# Patient Record
Sex: Female | Born: 1935 | Race: White | Hispanic: No | Marital: Married | Smoking: Never smoker
Health system: Southern US, Community
[De-identification: ages and names within clinical notes are randomized; demographics above are authoritative.]

## PROBLEM LIST (undated history)

## (undated) DIAGNOSIS — F329 Major depressive disorder, single episode, unspecified: Secondary | ICD-10-CM

## (undated) DIAGNOSIS — K635 Polyp of colon: Secondary | ICD-10-CM

## (undated) DIAGNOSIS — Z923 Personal history of irradiation: Secondary | ICD-10-CM

## (undated) DIAGNOSIS — D649 Anemia, unspecified: Secondary | ICD-10-CM

## (undated) DIAGNOSIS — N189 Chronic kidney disease, unspecified: Secondary | ICD-10-CM

## (undated) DIAGNOSIS — K2950 Unspecified chronic gastritis without bleeding: Secondary | ICD-10-CM

## (undated) DIAGNOSIS — F32A Depression, unspecified: Secondary | ICD-10-CM

## (undated) DIAGNOSIS — C50919 Malignant neoplasm of unspecified site of unspecified female breast: Secondary | ICD-10-CM

## (undated) DIAGNOSIS — K859 Acute pancreatitis without necrosis or infection, unspecified: Secondary | ICD-10-CM

## (undated) DIAGNOSIS — M810 Age-related osteoporosis without current pathological fracture: Secondary | ICD-10-CM

## (undated) DIAGNOSIS — C649 Malignant neoplasm of unspecified kidney, except renal pelvis: Secondary | ICD-10-CM

## (undated) DIAGNOSIS — I10 Essential (primary) hypertension: Secondary | ICD-10-CM

## (undated) DIAGNOSIS — K259 Gastric ulcer, unspecified as acute or chronic, without hemorrhage or perforation: Secondary | ICD-10-CM

## (undated) DIAGNOSIS — M109 Gout, unspecified: Secondary | ICD-10-CM

## (undated) DIAGNOSIS — E785 Hyperlipidemia, unspecified: Secondary | ICD-10-CM

## (undated) HISTORY — DX: Unspecified chronic gastritis without bleeding: K29.50

## (undated) HISTORY — PX: THYROID SURGERY: SHX805

## (undated) HISTORY — PX: NEPHRECTOMY: SHX65

## (undated) HISTORY — DX: Anemia, unspecified: D64.9

## (undated) HISTORY — DX: Hyperlipidemia, unspecified: E78.5

## (undated) HISTORY — PX: KIDNEY SURGERY: SHX687

## (undated) HISTORY — DX: Chronic kidney disease, unspecified: N18.9

## (undated) HISTORY — DX: Major depressive disorder, single episode, unspecified: F32.9

## (undated) HISTORY — PX: LUMBAR LAMINECTOMY: SHX95

## (undated) HISTORY — DX: Malignant neoplasm of unspecified kidney, except renal pelvis: C64.9

## (undated) HISTORY — DX: Gout, unspecified: M10.9

## (undated) HISTORY — DX: Gastric ulcer, unspecified as acute or chronic, without hemorrhage or perforation: K25.9

## (undated) HISTORY — PX: TONSILLECTOMY: SUR1361

## (undated) HISTORY — PX: CHOLECYSTECTOMY: SHX55

## (undated) HISTORY — DX: Age-related osteoporosis without current pathological fracture: M81.0

## (undated) HISTORY — DX: Depression, unspecified: F32.A

## (undated) HISTORY — DX: Malignant neoplasm of unspecified site of unspecified female breast: C50.919

## (undated) HISTORY — DX: Polyp of colon: K63.5

## (undated) HISTORY — DX: Essential (primary) hypertension: I10

## (undated) HISTORY — DX: Acute pancreatitis without necrosis or infection, unspecified: K85.90

---

## 2006-02-09 ENCOUNTER — Ambulatory Visit: Payer: Self-pay | Admitting: Internal Medicine

## 2006-10-08 ENCOUNTER — Emergency Department: Payer: Self-pay

## 2006-10-28 ENCOUNTER — Inpatient Hospital Stay: Payer: Self-pay | Admitting: Internal Medicine

## 2006-10-28 ENCOUNTER — Other Ambulatory Visit: Payer: Self-pay

## 2007-02-17 ENCOUNTER — Ambulatory Visit: Payer: Self-pay | Admitting: Internal Medicine

## 2007-08-09 ENCOUNTER — Ambulatory Visit: Payer: Self-pay | Admitting: Internal Medicine

## 2008-08-28 ENCOUNTER — Ambulatory Visit: Payer: Self-pay | Admitting: Internal Medicine

## 2008-11-15 ENCOUNTER — Ambulatory Visit: Payer: Self-pay | Admitting: Internal Medicine

## 2008-11-27 ENCOUNTER — Ambulatory Visit: Payer: Self-pay | Admitting: Internal Medicine

## 2008-12-25 ENCOUNTER — Ambulatory Visit: Payer: Self-pay | Admitting: Surgery

## 2009-01-05 ENCOUNTER — Ambulatory Visit: Payer: Self-pay | Admitting: Internal Medicine

## 2009-01-05 DIAGNOSIS — Z923 Personal history of irradiation: Secondary | ICD-10-CM

## 2009-01-05 HISTORY — PX: BREAST LUMPECTOMY: SHX2

## 2009-01-05 HISTORY — PX: BREAST EXCISIONAL BIOPSY: SUR124

## 2009-01-05 HISTORY — DX: Personal history of irradiation: Z92.3

## 2009-01-08 ENCOUNTER — Ambulatory Visit: Payer: Self-pay | Admitting: Surgery

## 2009-01-11 ENCOUNTER — Ambulatory Visit: Payer: Self-pay | Admitting: Surgery

## 2009-01-23 ENCOUNTER — Ambulatory Visit: Payer: Self-pay | Admitting: Internal Medicine

## 2009-02-05 ENCOUNTER — Ambulatory Visit: Payer: Self-pay | Admitting: Internal Medicine

## 2009-02-05 ENCOUNTER — Ambulatory Visit: Payer: Self-pay | Admitting: Radiation Oncology

## 2009-03-05 ENCOUNTER — Ambulatory Visit: Payer: Self-pay | Admitting: Internal Medicine

## 2009-03-05 ENCOUNTER — Ambulatory Visit: Payer: Self-pay | Admitting: Radiation Oncology

## 2009-04-05 ENCOUNTER — Ambulatory Visit: Payer: Self-pay | Admitting: Radiation Oncology

## 2009-04-05 ENCOUNTER — Ambulatory Visit: Payer: Self-pay | Admitting: Internal Medicine

## 2009-05-05 ENCOUNTER — Ambulatory Visit: Payer: Self-pay | Admitting: Radiation Oncology

## 2009-05-05 ENCOUNTER — Ambulatory Visit: Payer: Self-pay | Admitting: Internal Medicine

## 2009-07-05 ENCOUNTER — Ambulatory Visit: Payer: Self-pay | Admitting: Internal Medicine

## 2009-07-17 ENCOUNTER — Ambulatory Visit: Payer: Self-pay | Admitting: Internal Medicine

## 2009-08-05 ENCOUNTER — Ambulatory Visit: Payer: Self-pay | Admitting: Internal Medicine

## 2009-09-05 ENCOUNTER — Ambulatory Visit: Payer: Self-pay | Admitting: Radiation Oncology

## 2009-10-02 ENCOUNTER — Ambulatory Visit: Payer: Self-pay | Admitting: Radiation Oncology

## 2009-10-05 ENCOUNTER — Ambulatory Visit: Payer: Self-pay | Admitting: Radiation Oncology

## 2009-11-20 ENCOUNTER — Ambulatory Visit: Payer: Self-pay | Admitting: Internal Medicine

## 2009-11-21 ENCOUNTER — Ambulatory Visit: Payer: Self-pay | Admitting: Internal Medicine

## 2009-12-05 ENCOUNTER — Ambulatory Visit: Payer: Self-pay | Admitting: Internal Medicine

## 2010-05-15 ENCOUNTER — Ambulatory Visit: Payer: Self-pay | Admitting: Gastroenterology

## 2010-05-19 LAB — PATHOLOGY REPORT

## 2010-05-23 ENCOUNTER — Ambulatory Visit: Payer: Self-pay | Admitting: Internal Medicine

## 2010-06-06 ENCOUNTER — Ambulatory Visit: Payer: Self-pay | Admitting: Internal Medicine

## 2010-10-06 ENCOUNTER — Ambulatory Visit: Payer: Self-pay | Admitting: Internal Medicine

## 2010-11-06 ENCOUNTER — Ambulatory Visit: Payer: Self-pay | Admitting: Internal Medicine

## 2010-12-03 ENCOUNTER — Ambulatory Visit: Payer: Self-pay | Admitting: Family Medicine

## 2011-03-23 ENCOUNTER — Ambulatory Visit: Payer: Self-pay | Admitting: Internal Medicine

## 2011-03-23 LAB — CBC CANCER CENTER
Basophil #: 0 x10 3/mm (ref 0.0–0.1)
Basophil %: 0.4 %
Eosinophil #: 0.3 x10 3/mm (ref 0.0–0.7)
HCT: 34.9 % — ABNORMAL LOW (ref 35.0–47.0)
Lymphocyte #: 1.3 x10 3/mm (ref 1.0–3.6)
MCH: 30.8 pg (ref 26.0–34.0)
MCV: 91 fL (ref 80–100)
Monocyte #: 0.4 x10 3/mm (ref 0.0–0.7)
Monocyte %: 7.2 %
Neutrophil #: 4.1 x10 3/mm (ref 1.4–6.5)
RBC: 3.83 10*6/uL (ref 3.80–5.20)
RDW: 15.6 % — ABNORMAL HIGH (ref 11.5–14.5)
WBC: 6.1 x10 3/mm (ref 3.6–11.0)

## 2011-03-23 LAB — CREATININE, SERUM
Creatinine: 2.64 mg/dL — ABNORMAL HIGH (ref 0.60–1.30)
EGFR (African American): 23 — ABNORMAL LOW

## 2011-03-23 LAB — HEPATIC FUNCTION PANEL A (ARMC)
Alkaline Phosphatase: 61 U/L (ref 50–136)
Bilirubin, Direct: 0.1 mg/dL (ref 0.00–0.20)
Bilirubin,Total: 0.2 mg/dL (ref 0.2–1.0)
SGPT (ALT): 37 U/L
Total Protein: 7.6 g/dL (ref 6.4–8.2)

## 2011-04-06 ENCOUNTER — Ambulatory Visit: Payer: Self-pay | Admitting: Internal Medicine

## 2011-07-12 ENCOUNTER — Emergency Department: Payer: Self-pay | Admitting: Emergency Medicine

## 2011-07-12 LAB — URINALYSIS, COMPLETE
Bacteria: NONE SEEN
Bilirubin,UR: NEGATIVE
Blood: NEGATIVE
Glucose,UR: NEGATIVE mg/dL (ref 0–75)
Leukocyte Esterase: NEGATIVE
Nitrite: NEGATIVE
Protein: NEGATIVE
Specific Gravity: 1.003 (ref 1.003–1.030)

## 2011-07-12 LAB — COMPREHENSIVE METABOLIC PANEL
Albumin: 4 g/dL (ref 3.4–5.0)
Alkaline Phosphatase: 72 U/L (ref 50–136)
Anion Gap: 6 — ABNORMAL LOW (ref 7–16)
BUN: 65 mg/dL — ABNORMAL HIGH (ref 7–18)
Bilirubin,Total: 0.2 mg/dL (ref 0.2–1.0)
Calcium, Total: 9.2 mg/dL (ref 8.5–10.1)
Glucose: 123 mg/dL — ABNORMAL HIGH (ref 65–99)
Osmolality: 296 (ref 275–301)
Potassium: 4.3 mmol/L (ref 3.5–5.1)
SGOT(AST): 23 U/L (ref 15–37)
SGPT (ALT): 24 U/L
Sodium: 138 mmol/L (ref 136–145)
Total Protein: 8 g/dL (ref 6.4–8.2)

## 2011-07-12 LAB — CBC
HGB: 12.2 g/dL (ref 12.0–16.0)
MCH: 30.8 pg (ref 26.0–34.0)
MCV: 93 fL (ref 80–100)
RBC: 3.98 10*6/uL (ref 3.80–5.20)
WBC: 5.8 10*3/uL (ref 3.6–11.0)

## 2011-07-12 LAB — LIPASE, BLOOD: Lipase: 613 U/L — ABNORMAL HIGH (ref 73–393)

## 2011-10-12 ENCOUNTER — Ambulatory Visit: Payer: Self-pay | Admitting: Internal Medicine

## 2011-12-10 ENCOUNTER — Ambulatory Visit: Payer: Self-pay | Admitting: Family Medicine

## 2011-12-23 ENCOUNTER — Ambulatory Visit: Payer: Self-pay | Admitting: Internal Medicine

## 2011-12-23 LAB — CREATININE, SERUM: EGFR (African American): 19 — ABNORMAL LOW

## 2011-12-23 LAB — CBC CANCER CENTER
Basophil #: 0.1 x10 3/mm (ref 0.0–0.1)
Eosinophil #: 0.2 x10 3/mm (ref 0.0–0.7)
HCT: 33.9 % — ABNORMAL LOW (ref 35.0–47.0)
Lymphocyte #: 0.9 x10 3/mm — ABNORMAL LOW (ref 1.0–3.6)
MCH: 31.5 pg (ref 26.0–34.0)
MCHC: 34.7 g/dL (ref 32.0–36.0)
MCV: 91 fL (ref 80–100)
Monocyte #: 0.5 x10 3/mm (ref 0.2–0.9)
Neutrophil #: 3.9 x10 3/mm (ref 1.4–6.5)
RBC: 3.74 10*6/uL — ABNORMAL LOW (ref 3.80–5.20)
RDW: 13.5 % (ref 11.5–14.5)

## 2011-12-23 LAB — HEPATIC FUNCTION PANEL A (ARMC)
Alkaline Phosphatase: 74 U/L (ref 50–136)
Bilirubin, Direct: <0.05 mg/dL (ref 0.00–0.20)
Bilirubin,Total: 0.2 mg/dL (ref 0.2–1.0)
SGOT(AST): 17 U/L (ref 15–37)

## 2012-01-06 ENCOUNTER — Ambulatory Visit: Payer: Self-pay | Admitting: Internal Medicine

## 2012-04-12 ENCOUNTER — Ambulatory Visit: Payer: Self-pay | Admitting: Family Medicine

## 2012-04-21 ENCOUNTER — Ambulatory Visit: Payer: Self-pay | Admitting: Family Medicine

## 2012-04-28 ENCOUNTER — Ambulatory Visit: Payer: Self-pay | Admitting: Family Medicine

## 2012-05-06 ENCOUNTER — Ambulatory Visit: Payer: Self-pay | Admitting: Gastroenterology

## 2012-05-11 ENCOUNTER — Ambulatory Visit: Payer: Self-pay | Admitting: Gastroenterology

## 2012-05-11 LAB — BASIC METABOLIC PANEL
Calcium, Total: 9.1 mg/dL (ref 8.5–10.1)
Co2: 28 mmol/L (ref 21–32)
Creatinine: 2.27 mg/dL — ABNORMAL HIGH (ref 0.60–1.30)
EGFR (African American): 24 — ABNORMAL LOW
Osmolality: 288 (ref 275–301)

## 2012-06-05 ENCOUNTER — Ambulatory Visit: Payer: Self-pay | Admitting: Internal Medicine

## 2012-06-08 ENCOUNTER — Ambulatory Visit: Payer: Self-pay | Admitting: Surgery

## 2012-06-08 DIAGNOSIS — I10 Essential (primary) hypertension: Secondary | ICD-10-CM

## 2012-06-13 ENCOUNTER — Ambulatory Visit: Payer: Self-pay | Admitting: Surgery

## 2012-06-14 LAB — PATHOLOGY REPORT

## 2012-06-29 ENCOUNTER — Ambulatory Visit: Payer: Self-pay | Admitting: Internal Medicine

## 2012-06-29 LAB — CREATININE, SERUM
Creatinine: 3.06 mg/dL — ABNORMAL HIGH (ref 0.60–1.30)
EGFR (African American): 17 — ABNORMAL LOW
EGFR (Non-African Amer.): 14 — ABNORMAL LOW

## 2012-06-29 LAB — CBC CANCER CENTER
Basophil #: 0.1 x10 3/mm (ref 0.0–0.1)
Basophil %: 1.4 %
Eosinophil #: 0.2 x10 3/mm (ref 0.0–0.7)
Eosinophil %: 2.5 %
HCT: 34.3 % — ABNORMAL LOW (ref 35.0–47.0)
Lymphocyte #: 1 x10 3/mm (ref 1.0–3.6)
Lymphocyte %: 15 %
MCH: 31.2 pg (ref 26.0–34.0)
MCHC: 34.1 g/dL (ref 32.0–36.0)
Monocyte %: 6.4 %
Neutrophil #: 5 x10 3/mm (ref 1.4–6.5)
Neutrophil %: 74.7 %
RDW: 12.9 % (ref 11.5–14.5)

## 2012-06-29 LAB — HEPATIC FUNCTION PANEL A (ARMC)
Alkaline Phosphatase: 80 U/L (ref 50–136)
Bilirubin, Direct: 0.1 mg/dL (ref 0.00–0.20)
Bilirubin,Total: 0.3 mg/dL (ref 0.2–1.0)
SGPT (ALT): 28 U/L (ref 12–78)
Total Protein: 7.5 g/dL (ref 6.4–8.2)

## 2012-07-05 ENCOUNTER — Ambulatory Visit: Payer: Self-pay | Admitting: Internal Medicine

## 2012-10-26 ENCOUNTER — Ambulatory Visit: Payer: Self-pay | Admitting: Internal Medicine

## 2012-11-05 ENCOUNTER — Ambulatory Visit: Payer: Self-pay | Admitting: Internal Medicine

## 2012-12-12 ENCOUNTER — Ambulatory Visit: Payer: Self-pay | Admitting: Family Medicine

## 2012-12-27 ENCOUNTER — Emergency Department: Payer: Self-pay | Admitting: Emergency Medicine

## 2013-07-10 ENCOUNTER — Ambulatory Visit: Payer: Self-pay | Admitting: Internal Medicine

## 2013-07-10 LAB — CBC CANCER CENTER
Basophil #: 0.1 x10 3/mm (ref 0.0–0.1)
Basophil %: 1.1 %
EOS PCT: 3 %
Eosinophil #: 0.2 x10 3/mm (ref 0.0–0.7)
HCT: 34.4 % — ABNORMAL LOW (ref 35.0–47.0)
HGB: 11.2 g/dL — ABNORMAL LOW (ref 12.0–16.0)
Lymphocyte #: 1 x10 3/mm (ref 1.0–3.6)
Lymphocyte %: 15.2 %
MCH: 29.9 pg (ref 26.0–34.0)
MCHC: 32.6 g/dL (ref 32.0–36.0)
MCV: 92 fL (ref 80–100)
MONO ABS: 0.5 x10 3/mm (ref 0.2–0.9)
Monocyte %: 7.6 %
NEUTROS ABS: 4.7 x10 3/mm (ref 1.4–6.5)
NEUTROS PCT: 73.1 %
Platelet: 180 x10 3/mm (ref 150–440)
RBC: 3.75 10*6/uL — ABNORMAL LOW (ref 3.80–5.20)
RDW: 13.4 % (ref 11.5–14.5)
WBC: 6.4 x10 3/mm (ref 3.6–11.0)

## 2013-07-10 LAB — CREATININE, SERUM
Creatinine: 2.53 mg/dL — ABNORMAL HIGH (ref 0.60–1.30)
EGFR (African American): 21 — ABNORMAL LOW
GFR CALC NON AF AMER: 18 — AB

## 2013-07-10 LAB — HEPATIC FUNCTION PANEL A (ARMC)
Albumin: 3.8 g/dL (ref 3.4–5.0)
Alkaline Phosphatase: 58 U/L
BILIRUBIN TOTAL: 0.2 mg/dL (ref 0.2–1.0)
SGOT(AST): 13 U/L — ABNORMAL LOW (ref 15–37)
SGPT (ALT): 20 U/L (ref 12–78)
TOTAL PROTEIN: 7.3 g/dL (ref 6.4–8.2)

## 2013-08-05 ENCOUNTER — Ambulatory Visit: Payer: Self-pay | Admitting: Internal Medicine

## 2013-08-24 ENCOUNTER — Ambulatory Visit: Payer: Self-pay | Admitting: Gastroenterology

## 2013-08-25 LAB — PATHOLOGY REPORT

## 2013-10-25 ENCOUNTER — Ambulatory Visit: Payer: Self-pay | Admitting: Internal Medicine

## 2013-11-05 ENCOUNTER — Ambulatory Visit: Payer: Self-pay | Admitting: Internal Medicine

## 2013-12-27 ENCOUNTER — Ambulatory Visit: Payer: Self-pay | Admitting: Family Medicine

## 2014-03-29 ENCOUNTER — Ambulatory Visit: Payer: Self-pay | Admitting: Surgery

## 2014-04-27 NOTE — Op Note (Signed)
PATIENT NAME:  Erin Mccormick, Erin Mccormick MR#:  [id number] DATE OF BIRTH:  11-May-1935  DATE OF PROCEDURE:  06/13/2012  PREOPERATIVE DIAGNOSIS: Chronic acalculus cholecystitis.   POSTOPERATIVE DIAGNOSIS: Chronic acalculus cholecystitis.   PROCEDURE: Laparoscopic cholecystectomy.   SURGEON: Rochel Brome, M.D.   ANESTHESIA: General.   INDICATIONS: This 79 year old female has a history of epigastric pains, abnormally low gallbladder ejection fraction of 30% and surgery was recommended for definitive treatment.   DESCRIPTION OF PROCEDURE: The patient was placed on the operating table in the supine position under general anesthesia. The abdomen was prepared with ChloraPrep and draped in a sterile manner. A short incision was made in the inferior aspect of the umbilicus and carried down to the deep fascia, which was grasped with laryngeal hook and elevated. A Veress needle was inserted, aspirated, and irrigated with saline solution. Next, the peritoneal cavity was inflated with carbon dioxide. The Veress needle was removed. The 10 mm cannula was inserted. The 10 mm 0 degree laparoscope was inserted to view the peritoneal cavity. Another incision was made in the epigastrium slightly to the right of the midline to introduce an 11 mm cannula. Two incisions were made in the lateral aspect of the right upper quadrant to introduce two 5 mm cannulas.   Initial inspection revealed that there were some adhesions surrounding the gallbladder. The gallbladder was retracted towards the right shoulder. Multiple adhesions were taken down with blunt and sharp dissection and use of electrocautery. The pouch of Randol Kern was retracted inferiorly and laterally. The common bile duct was identified. Dissection was carried out to isolate the cystic duct from surrounding structures. Also, the cystic artery was dissected from free from surrounding structures. The neck of the gallbladder was mobilized with incision of the visceral peritoneum. A  critical view of safety was demonstrated. An Endo Clip was placed across the cystic duct adjacent to the neck of the gallbladder. An incision was made in the cystic duct to introduce a Reddick catheter. The catheter would only thread in just a few millimeters and would not stay in and, therefore, cholangiogram was not done. The Reddick catheter was removed. The cystic duct was doubly ligated with endoclips and divided. The cystic artery was controlled with double endoclips and divided. A small amount of blood was aspirated. The gallbladder was dissected free from the liver with hook and cautery. Hemostasis was subsequently intact. The gallbladder was delivered up through the infraumbilical incision, opened and suctioned, removed and submitted in formalin for routine pathology. The right upper quadrant was further inspected. Hemostasis was intact. The cannulas were removed. Carbon dioxide was allowed to escape from the peritoneal cavity. Skin incisions were closed with interrupted 5-0 chromic subcuticular sutures, benzoin, and Steri-Strips. Dressings were applied with paper tape. The patient tolerated surgery satisfactorily and was then prepared for transfer to the recovery room.   ____________________________ Lenna Sciara. Rochel Brome, MD jws:aw D: 06/13/2012 10:55:58 ET T: 06/13/2012 12:03:42 ET JOB#: [id number]  cc: Loreli Dollar, MD, <Dictator> Loreli Dollar MD ELECTRONICALLY SIGNED 06/15/2012 8:40

## 2014-05-18 ENCOUNTER — Other Ambulatory Visit: Payer: Self-pay | Admitting: Internal Medicine

## 2014-05-18 ENCOUNTER — Encounter: Payer: Self-pay | Admitting: *Deleted

## 2014-06-11 ENCOUNTER — Other Ambulatory Visit (INDEPENDENT_AMBULATORY_CARE_PROVIDER_SITE_OTHER): Payer: Medicare Other

## 2014-06-11 ENCOUNTER — Ambulatory Visit (INDEPENDENT_AMBULATORY_CARE_PROVIDER_SITE_OTHER): Payer: Medicare Other | Admitting: Internal Medicine

## 2014-06-11 ENCOUNTER — Encounter: Payer: Self-pay | Admitting: Internal Medicine

## 2014-06-11 VITALS — BP 138/60 | HR 62 | Ht 63.75 in | Wt 123.4 lb

## 2014-06-11 DIAGNOSIS — R197 Diarrhea, unspecified: Secondary | ICD-10-CM

## 2014-06-11 DIAGNOSIS — K529 Noninfective gastroenteritis and colitis, unspecified: Secondary | ICD-10-CM

## 2014-06-11 DIAGNOSIS — Z8719 Personal history of other diseases of the digestive system: Secondary | ICD-10-CM

## 2014-06-11 DIAGNOSIS — D649 Anemia, unspecified: Secondary | ICD-10-CM

## 2014-06-11 LAB — IGA: IgA: 111 mg/dL (ref 68–378)

## 2014-06-11 MED ORDER — DIPHENOXYLATE-ATROPINE 2.5-0.025 MG PO TABS: 1.0000 | ORAL_TABLET | Freq: Four times a day (QID) | ORAL | Status: DC | PRN

## 2014-06-11 NOTE — Patient Instructions (Addendum)
Your physician has requested that you go to the basement for the following lab work before leaving today: IGA and TTG  We have sent the following medications to your pharmacy for you to pick up at your convenience: Lomotil (faxed to pharmacy)  Continue your current medications.  Call us back if the diarrhea returns.  Follow up with Dr Hilarie Fredrickson in 6 months, patient to call back.  We will obtain your records for [facility]  For review.   I appreciate the opportunity to care for you.

## 2014-06-11 NOTE — Progress Notes (Signed)
Patient ID: Erin Mccormick, female   DOB: 05-08-1935, 79 y.o.   MRN: [id number] HPI: Erin Mccormick is a 79 year old female with past medical history of renal cell carcinoma status post left nephrectomy in 2002, partial right nephrectomy in 2004, chronic kidney disease followed by nephrology at [facility], chronic normocytic anemia, history of breast cancer, history of gastritis, hypertension and hyperlipidemia who is seen to evaluate chronic diarrhea. She's here today with her husband. She reports 3 years ago developing urgent loose stools. This seemed to start abruptly. This started prior to cholecystectomy which occurred in 2014. She reports that her stools would occur typically after eating and can be urgent. She's had multiple accidents and for a while was wearing adult diapers because she could not predict or control now movements. Bowel movements were never bloody or melenic. They were inconsistent related to what she was eating. It was evaluated by Dr. Candace Cruise in [place] and she reportedly had a normal colonoscopy in August 2015. She has been taking pantoprazole 40 mg daily and she has moved the dosing of this around. She was taken in the morning but told to separate it from her thyroid replacement, though she moved it to before dinner and she felt like this possibly worsen her loose stools. She has now moved it to before lunch and symptoms have improved significantly. Currently she denies having diarrhea and she has been happy with her bowel movements over the last month. Stools have recently been formed and regular occurring 1-2 times per day. About 10 days ago she had a single day of diarrhea also with vomiting which is very atypical for her. She feels like she had a viral infection. She denies abdominal pain and has not had abdominal pain throughout these events. She does notice lower cramping which is relieved by defecation when she is having loose stools. Overall appetite has been good and her weight has been  stable of late. She has a history of solid food dysphagia and reportedly had an upper endoscopy with dilation and this improved the symptoms. Regarding her anemia I inquired and this is stable. She's had IV iron in the past which did not help her anemia. She denies a family history of GI tract malignancy or IBD.   Past Medical History  Diagnosis Date  . Chronic gastritis   . Renal cell carcinoma     left  . CKD (chronic kidney disease)   . Hypertension   . Anemia   . Hyperlipidemia   . Colon polyp   . Breast cancer left   . Depression   . Gastric ulcer   . Osteoporosis   . Gout     Past Surgical History  Procedure Laterality Date  . Cholecystectomy    . Nephrectomy Left   . Thyroid surgery    . Lumbar laminectomy    . Mastectomy, partial Left   . Kidney surgery Right   . Tonsillectomy      No outpatient prescriptions prior to visit.   No facility-administered medications prior to visit.    Allergies  Allergen Reactions  . Morphine And Related Nausea And Vomiting    Family History  Problem Relation Age of Onset  . Colon cancer Neg Hx   . Lung cancer Father   . Kidney disease Neg Hx     History  Substance Use Topics  . Smoking status: Never Smoker   . Smokeless tobacco: Not on file  . Alcohol Use: No    ROS: As per  history of present illness, otherwise negative  BP 138/60 mmHg  Pulse 62  Ht 5' 3.75" (1.619 m)  Wt 123 lb 6 oz (55.963 kg)  BMI 21.35 kg/m2 Constitutional: Well-developed and well-nourished. No distress. HEENT: Normocephalic and atraumatic. Oropharynx is clear and moist. No oropharyngeal exudate. Conjunctivae are normal.  No scleral icterus. Neck: Neck supple. Trachea midline. Cardiovascular: Normal rate, regular rhythm and intact distal pulses. No M/R/G Pulmonary/chest: Effort normal and breath sounds normal. No wheezing, rales or rhonchi. Abdominal: Soft, nontender, nondistended. Bowel sounds active throughout. Extremities: no clubbing,  cyanosis, or edema Lymphadenopathy: No cervical adenopathy noted. Neurological: Alert and oriented to person place and time. Skin: Skin is warm and dry. No rashes noted. Psychiatric: Normal mood and affect. Behavior is normal.  RELEVANT LABS AND IMAGING: CBC    Component Value Date/Time   WBC 6.4 07/10/2013 1419   RBC 3.75* 07/10/2013 1419   HGB 11.2* 07/10/2013 1419   HCT 34.4* 07/10/2013 1419   PLT 180 07/10/2013 1419   MCV 92 07/10/2013 1419   MCH 29.9 07/10/2013 1419   MCHC 32.6 07/10/2013 1419   RDW 13.4 07/10/2013 1419   LYMPHSABS 1.0 07/10/2013 1419   MONOABS 0.5 07/10/2013 1419   EOSABS 0.2 07/10/2013 1419   BASOSABS 0.1 07/10/2013 1419    CMP     Component Value Date/Time   NA 139 05/11/2012 1255   K 3.7 05/11/2012 1255   CL 107 05/11/2012 1255   CO2 28 05/11/2012 1255   GLUCOSE 100* 05/11/2012 1255   BUN 41* 05/11/2012 1255   CREATININE 2.53* 07/10/2013 1419   CALCIUM 9.1 05/11/2012 1255   PROT 7.3 07/10/2013 1419   ALBUMIN 3.8 07/10/2013 1419   AST 13* 07/10/2013 1419   ALT 20 07/10/2013 1419   ALKPHOS 58 07/10/2013 1419   GFRNONAA 18* 07/10/2013 1419   GFRAA 21* 07/10/2013 1419   Colonoscopy 08/24/2013 -- the entire examined colon is normal. Biopsies performed. Pathology = colonic mucosa with mildly increased nonspecific inflammation within the lamina propria. Negative for intraepithelial lymphocytosis, increased subepithelial collagen, viral cytopathic effect, dysplasia or malignancy  Labs reviewed from one week ago, CMP within normal limits with the exception of BUN 60, creatinine 2.6 Hgb A1c 5.9 WBC 3.7, hemoglobin 11.5, MCV 91.2, platelet 197 TSH 2.74  ASSESSMENT/PLAN: 79 year old female with past medical history of renal cell carcinoma status post left nephrectomy in 2002, partial right nephrectomy in 2004, chronic kidney disease followed by nephrology at [facility], chronic normocytic anemia, history of breast cancer, history of gastritis, hypertension  and hyperlipidemia who is seen to evaluate chronic diarrhea.  1. Chronic diarrhea with urgency -- symptoms under control now without medication. Can be unpredictable. Lomotil 1 tablet 4 times daily as needed for diarrhea. Records reviewed extensively. Unclear etiology of nonspecific inflammation seen on random colon biopsies from 10 months ago. Given lack of symptoms now, I am not treating specifically chronic diarrhea with medications at this time. If symptoms recur would consider trial of budesonide 9 mg daily for possible microscopic colitis. If no response will consider course of rifaximin, and if no response than possibly mesalamine. We discussed how chronic diarrhea concomitantly go and that she should notify me if this occurs. Also check celiac panel today to rule out this as a possible cause. Finally we discussed PPI, as this medication can lead to loose stools. She is currently taking it once daily and doesn't seem to be having diarrhea, see #2  2.  History of chronic gastritis -- this  alone is likely not an indication for chronic PPI. We may be able to stop this medicine. I have requested records from her previous endoscopies and pathology records for review. Once this occurs we can make decision about the need for continuous PPI therapy. Of note she denies a history of heartburn.  3. History of dysphagia -- she recalls a Schatzki's ring symptoms improved after dilation. Await EGD report to ensure no evidence of Barrett's esophagus or erosive esophagitis. Repeat dilation can be considered if dysphagia returns.  Followup in 6 months 60 min spent today, of which greater than 50% of this time with the patient directly discussing the above issues

## 2014-06-12 LAB — TISSUE TRANSGLUTAMINASE, IGA: Tissue Transglutaminase Ab, IgA: 1 U/mL (ref <4)

## 2014-06-21 ENCOUNTER — Ambulatory Visit: Payer: Medicare Other | Admitting: Internal Medicine

## 2014-07-07 ENCOUNTER — Other Ambulatory Visit: Payer: Self-pay

## 2014-07-07 DIAGNOSIS — C50912 Malignant neoplasm of unspecified site of left female breast: Secondary | ICD-10-CM

## 2014-07-10 ENCOUNTER — Telehealth: Payer: Self-pay | Admitting: *Deleted

## 2014-07-10 NOTE — Telephone Encounter (Signed)
Patient returned call. Will get those Endoscopy reports faxed to [place]. She also wanted Dr Hilarie Fredrickson to know she stopped taking Prilosec and is somewhat better now

## 2014-07-10 NOTE — Telephone Encounter (Signed)
L/M for pt to contact the office. EGD report missing from records from [facility]

## 2014-07-11 ENCOUNTER — Inpatient Hospital Stay (HOSPITAL_BASED_OUTPATIENT_CLINIC_OR_DEPARTMENT_OTHER): Payer: Medicare Other | Admitting: Internal Medicine

## 2014-07-11 ENCOUNTER — Inpatient Hospital Stay: Payer: Medicare Other | Attending: Internal Medicine

## 2014-07-11 VITALS — BP 149/56 | HR 52 | Temp 95.9°F | Wt 121.7 lb

## 2014-07-11 DIAGNOSIS — Z853 Personal history of malignant neoplasm of breast: Secondary | ICD-10-CM | POA: Diagnosis present

## 2014-07-11 DIAGNOSIS — C50912 Malignant neoplasm of unspecified site of left female breast: Secondary | ICD-10-CM

## 2014-07-11 DIAGNOSIS — Z170 Estrogen receptor positive status [ER+]: Secondary | ICD-10-CM | POA: Diagnosis not present

## 2014-07-11 DIAGNOSIS — Z905 Acquired absence of kidney: Secondary | ICD-10-CM | POA: Insufficient documentation

## 2014-07-11 DIAGNOSIS — R232 Flushing: Secondary | ICD-10-CM | POA: Insufficient documentation

## 2014-07-11 DIAGNOSIS — Z9223 Personal history of estrogen therapy: Secondary | ICD-10-CM | POA: Insufficient documentation

## 2014-07-11 DIAGNOSIS — Z85528 Personal history of other malignant neoplasm of kidney: Secondary | ICD-10-CM | POA: Diagnosis not present

## 2014-07-11 DIAGNOSIS — Z7982 Long term (current) use of aspirin: Secondary | ICD-10-CM | POA: Diagnosis not present

## 2014-07-11 DIAGNOSIS — I129 Hypertensive chronic kidney disease with stage 1 through stage 4 chronic kidney disease, or unspecified chronic kidney disease: Secondary | ICD-10-CM | POA: Insufficient documentation

## 2014-07-11 DIAGNOSIS — M810 Age-related osteoporosis without current pathological fracture: Secondary | ICD-10-CM | POA: Diagnosis not present

## 2014-07-11 DIAGNOSIS — Z79899 Other long term (current) drug therapy: Secondary | ICD-10-CM | POA: Diagnosis not present

## 2014-07-11 DIAGNOSIS — E785 Hyperlipidemia, unspecified: Secondary | ICD-10-CM

## 2014-07-11 DIAGNOSIS — N189 Chronic kidney disease, unspecified: Secondary | ICD-10-CM | POA: Insufficient documentation

## 2014-07-11 LAB — HEPATIC FUNCTION PANEL
ALT: 15 U/L (ref 14–54)
AST: 18 U/L (ref 15–41)
Albumin: 4.2 g/dL (ref 3.5–5.0)
Alkaline Phosphatase: 46 U/L (ref 38–126)
BILIRUBIN TOTAL: 0.4 mg/dL (ref 0.3–1.2)
Total Protein: 7.3 g/dL (ref 6.5–8.1)

## 2014-07-11 LAB — CBC WITH DIFFERENTIAL/PLATELET
BASOS ABS: 0.1 10*3/uL (ref 0–0.1)
BASOS PCT: 1 %
Eosinophils Absolute: 0.2 10*3/uL (ref 0–0.7)
Eosinophils Relative: 3 %
HCT: 34.7 % — ABNORMAL LOW (ref 35.0–47.0)
HEMOGLOBIN: 11.1 g/dL — AB (ref 12.0–16.0)
Lymphocytes Relative: 17 %
Lymphs Abs: 0.9 10*3/uL — ABNORMAL LOW (ref 1.0–3.6)
MCH: 28.9 pg (ref 26.0–34.0)
MCHC: 32.1 g/dL (ref 32.0–36.0)
MCV: 90.1 fL (ref 80.0–100.0)
Monocytes Absolute: 0.5 10*3/uL (ref 0.2–0.9)
Monocytes Relative: 9 %
NEUTROS ABS: 4.1 10*3/uL (ref 1.4–6.5)
NEUTROS PCT: 70 %
Platelets: 186 10*3/uL (ref 150–440)
RBC: 3.85 MIL/uL (ref 3.80–5.20)
RDW: 13.6 % (ref 11.5–14.5)
WBC: 5.7 10*3/uL (ref 3.6–11.0)

## 2014-07-11 LAB — CREATININE, SERUM
Creatinine, Ser: 2.85 mg/dL — ABNORMAL HIGH (ref 0.44–1.00)
GFR calc Af Amer: 17 mL/min — ABNORMAL LOW (ref >60)
GFR, EST NON AFRICAN AMERICAN: 15 mL/min — AB (ref >60)

## 2014-07-12 ENCOUNTER — Telehealth: Payer: Self-pay

## 2014-07-12 NOTE — Telephone Encounter (Signed)
Per Dr. Hilarie Fredrickson he reviewed her EGD and states there was chronic gastritis from 05/2012. Recommends continuing daily PPI. Pt aware.

## 2014-07-22 NOTE — Progress Notes (Signed)
[facility]  [phone] [phone]     ID: Erin Mccormick OB: 1935-02-18  MR#: [id number]  DGU#:[id number]  Patient Care Team: Dion Body, MD as PCP - General (Family Medicine)  CHIEF COMPLAINT/DIAGNOSIS:  Stage I (T1c, N0, M0 clinical) infiltrating ductal carcinoma of the left breast, status post lumpectomy and sentinel node biopsy on January 11, 2009. Tumor size 1.3 cm, grade 1, margins clear  (0.8 cm invasive carcinoma of the surrounding by dense fibrosis containing smaller invasive foci measuring up to 0.5 cm and, combined healed and overall diameter of 1.3 cm) One sentinel lymph node negative for malignancy ER positive (greater than 90%)  and PR positive (50%). Her-2 neu negative (1+ on IHC).                               January 28, 2009 - Oncotype DX score is 20 (average rate of distant recurrence of 13%). Started aromatase inhibitor Arimidex in April 2011.   HISTORY OF PRESENT ILLNESS:  Patient returns for continued oncology followup, she was seen one year ago. States that she is doing steady, denies any new complaints.  She continues to take anastrozole, has some hot flashes but denies any worsening joint pains or stiffness. Appetite and weight are steady. No new mood disturbances. No new bone pains, last DEXA scan showed mild osteoporosis, denies any fractures or new back pain.  Denies feeling any new breast masses on self-exam. No nausea or vomiting.   REVIEW OF SYSTEMS:   ROS As in HPI above. In addition, no fever, chills or sweats. No new headaches or focal weakness.  No sore throat, cough, shortness of breath, sputum, hemoptysis or chest pain. No dizziness or palpitation. No abdominal pain, constipation, diarrhea, dysuria or hematuria. No new skin rash or bleeding symptoms. No new paresthesias in extremities. PS ECOG 0.   PAST MEDICAL HISTORY: Reviewed. Past Medical History  Diagnosis Date  . Chronic gastritis   . Renal cell  carcinoma     left  . CKD (chronic kidney disease)   . Hypertension   . Anemia   . Hyperlipidemia   . Colon polyp   . Breast cancer left   . Depression   . Gastric ulcer   . Osteoporosis   . Gout           Hyperlipidemia  Left Renal Cell Carcinoma status post partial left nephrectomy in 2002  Hypothyroidism  Hypertension  Chronic renal insufficiency  Partial right nephrectomy in 2004 for benign reason  History of thyroid biopsy, denies malignancy  Right eye surgery  Colonoscopy of May 2012 - tubular adenoma x4  Lumpectomy and sentinel node study for left breast stage I invasive ductal carcinoma on January 11, 2009  PAST SURGICAL HISTORY: Reviewed. Past Surgical History  Procedure Laterality Date  . Cholecystectomy    . Nephrectomy Left   . Thyroid surgery    . Lumbar laminectomy    . Mastectomy, partial Left   . Kidney surgery Right   . Tonsillectomy      FAMILY HISTORY: Reviewed. Family History  Problem Relation Age of Onset  . Colon cancer Neg Hx   . Lung cancer Father   . Kidney disease Neg Hx     SOCIAL HISTORY: Reviewed. History  Substance Use Topics  . Smoking status: Never Smoker   . Smokeless tobacco: Not on file  . Alcohol Use: No    Allergies  Allergen Reactions  . Morphine Nausea Only and Nausea And Vomiting  . Ace Inhibitors Other (See Comments)    hyperkalemia   . Morphine And Related Nausea And Vomiting  . Allopurinol Diarrhea  . Other Rash    Adhesive bandages- causes skin irritation and redness    Current Outpatient Prescriptions  Medication Sig Dispense Refill  . amLODipine (NORVASC) 10 MG tablet Take 10 mg by mouth daily.    Marland Kitchen anastrozole (ARIMIDEX) 1 MG tablet Take 1 mg by mouth daily.    Marland Kitchen aspirin 81 MG tablet Take 81 mg by mouth daily.    . Cholecalciferol (VITAMIN D) 2000 UNITS CAPS Take 2,000 capsules by mouth daily.    . diphenoxylate-atropine (LOMOTIL) 2.5-0.025 MG per tablet Take 1 tablet by mouth 4 (four) times daily as  needed for diarrhea or loose stools. 60 tablet 1  . furosemide (LASIX) 40 MG tablet Take 40 mg by mouth daily.    Marland Kitchen levothyroxine (SYNTHROID, LEVOTHROID) 88 MCG tablet Take 88 mcg by mouth daily.    . metoprolol (TOPROL-XL) 200 MG 24 hr tablet Take 200 mg by mouth daily.    . pantoprazole (PROTONIX) 40 MG tablet Take 40 mg by mouth daily.    Marland Kitchen PARoxetine (PAXIL) 10 MG tablet Take 10 mg by mouth daily.    . pravastatin (PRAVACHOL) 20 MG tablet Take 20 mg by mouth daily.     No current facility-administered medications for this visit.    PHYSICAL EXAM: Filed Vitals:   07/11/14 1450  BP: 149/56  Pulse: 52  Temp: 95.9 F (35.5 C)     Body mass index is 21.06 kg/(m^2).    ECOG FS:0 - Asymptomatic  GENERAL: Patient is alert and oriented and in no acute distress. There is no icterus. HEENT: EOMs intact. No cervical lymphadenopathy. CVS: S1S2, regular LUNGS: Bilaterally clear to auscultation, no rhonchi. ABDOMEN: Soft, nontender. No hepatomegaly clinically.  EXTREMITIES: No pedal edema.   LAB RESULTS:    Component Value Date/Time   NA 139 05/11/2012 1255   K 3.7 05/11/2012 1255   CL 107 05/11/2012 1255   CO2 28 05/11/2012 1255   GLUCOSE 100* 05/11/2012 1255   BUN 41* 05/11/2012 1255   CREATININE 2.85* 07/11/2014 1428   CREATININE 2.53* 07/10/2013 1419   CALCIUM 9.1 05/11/2012 1255   PROT 7.3 07/11/2014 1428   PROT 7.3 07/10/2013 1419   ALBUMIN 4.2 07/11/2014 1428   ALBUMIN 3.8 07/10/2013 1419   AST 18 07/11/2014 1428   AST 13* 07/10/2013 1419   ALT 15 07/11/2014 1428   ALT 20 07/10/2013 1419   ALKPHOS 46 07/11/2014 1428   ALKPHOS 58 07/10/2013 1419   BILITOT 0.4 07/11/2014 1428   GFRNONAA 15* 07/11/2014 1428   GFRNONAA 18* 07/10/2013 1419   GFRAA 17* 07/11/2014 1428   GFRAA 21* 07/10/2013 1419   Lab Results  Component Value Date   WBC 5.7 07/11/2014   NEUTROABS 4.1 07/11/2014   HGB 11.1* 07/11/2014   HCT 34.7* 07/11/2014   MCV 90.1 07/11/2014   PLT 186  07/11/2014     STUDIES: Dec 2015 - IMPRESSION: Right breast mass is probably benign as described above. No mammographic evidence of malignancy involving the left breast.  RECOMMENDATION: Recommend follow-up diagnostic right breast mammogram and ultrasound in 3 months to monitor for continued stability/benignity, given history of left breast cancer.  BI-RADS CATEGORY  3: Probably benign.  03/29/14 - Right breast mammogram/ultrasound.  IMPRESSION: No mammographic or sonographic evidence of malignancy. RECOMMENDATION:  Bilateral diagnostic mammogram in December 2016.   BI-RADS CATEGORY  3: Probably benign.   ASSESSMENT / PLAN:   1. Stage I infiltrating ductal carcinoma of the left breast, positive for ER and PR, negative for HER-2/neu  -  Reviewed labs and d/w patient. She does not have any clinical evidence of recurrent breast cancer. Surveillance mammogram in December and right mammmogram Mach 2016 was unremarkable for suspicious findings, BIRADS-3. Patient has completed planned 5 years of hormonal therapy with aromatase inhibitor Anastrozole. Given this, she is being discharged from our clinic. Have recommended she continue monthly breast self-exam, clinical exam upon PMD visits, and continue mammograms starting Dec 2016. I would be happy to see her back oin the future if any oncology issues should recur.  2. Osteoporosis surveillance - patient prefers repeat DEXA scan to be done by Dr.Linthavong. 3. In between visits, patient advised to call in case of any new side effects from anastrazole or new breast masses felt on self exam. She is agreeable to this plan.   Leia Alf, MD   07/22/2014 8:23 PM

## 2014-08-10 NOTE — Telephone Encounter (Signed)
Patient has not brought back endoscopy report to the office yet for review. Will send other records from [place] now to be scanned in.

## 2014-11-05 ENCOUNTER — Encounter: Payer: Self-pay | Admitting: *Deleted

## 2014-12-03 ENCOUNTER — Encounter: Payer: Self-pay | Admitting: Internal Medicine

## 2014-12-03 ENCOUNTER — Ambulatory Visit (INDEPENDENT_AMBULATORY_CARE_PROVIDER_SITE_OTHER): Payer: Medicare Other | Admitting: Internal Medicine

## 2014-12-03 VITALS — BP 140/54 | HR 64 | Ht 63.75 in | Wt 123.1 lb

## 2014-12-03 DIAGNOSIS — R1031 Right lower quadrant pain: Secondary | ICD-10-CM | POA: Diagnosis not present

## 2014-12-03 DIAGNOSIS — K589 Irritable bowel syndrome without diarrhea: Secondary | ICD-10-CM

## 2014-12-03 DIAGNOSIS — Z8719 Personal history of other diseases of the digestive system: Secondary | ICD-10-CM

## 2014-12-03 DIAGNOSIS — R195 Other fecal abnormalities: Secondary | ICD-10-CM | POA: Diagnosis not present

## 2014-12-03 DIAGNOSIS — R1032 Left lower quadrant pain: Secondary | ICD-10-CM

## 2014-12-03 MED ORDER — PANTOPRAZOLE SODIUM 20 MG PO TBEC: 20.0000 mg | DELAYED_RELEASE_TABLET | Freq: Every day | ORAL | Status: DC

## 2014-12-03 MED ORDER — DICYCLOMINE HCL 20 MG PO TABS: 20.0000 mg | ORAL_TABLET | Freq: Three times a day (TID) | ORAL | Status: DC | PRN

## 2014-12-03 NOTE — Progress Notes (Signed)
Subjective:    Patient ID: Erin Mccormick, female    DOB: 12/03/1935, 79 y.o.   MRN: [id number]  HPI Erin Mccormick is a 79 year old female with history of renal cell carcinoma status post left nephrectomy in 2002, partial right nephrectomy in 2004, chronic kidney disease, normocytic anemia, history of breast cancer, hypertension and hyperlipidemia who seen for follow-up. She sees me for history of gastritis and intermittent loose stools. She was last seen in July 2016. She is here alone today. She reports overall she has been doing well. She has occasional issues with loose stools and even less frequent days of diarrhea. This is associated with lower abdominal cramping type discomfort and bloating. She also complains of borborygmi and hearing her stomach "roll". She occasionally uses immotile when her diarrhea is a problem. When she has diarrhea she often feels "wiped out" and fatigued afterwards. Stools are nonbloody and non-melenic. Seems to worsen with lactose and she has recently switched to lactose-free milk and feels like this helps. Appetite remains good. No nausea or vomiting. She continues pantoprazole 40 mg daily given her history of erosive gastritis. No issues with swallowing. No issues with heartburn.   Review of Systems as per history of present illness, otherwise negative  Current Medications, Allergies, Past Medical History, Past Surgical History, Family History and Social History were reviewed in [employer] record.      Objective:   Physical Exam BP 140/54 mmHg  Pulse 64  Ht 5' 3.75" (1.619 m)  Wt 123 lb 2 oz (55.849 kg)  BMI 21.31 kg/m2 Constitutional: Well-developed and well-nourished. No distress. HEENT: Normocephalic and atraumatic. Oropharynx is clear and moist. No oropharyngeal exudate. Conjunctivae are normal.  No scleral icterus. Neck: Neck supple. Trachea midline. Cardiovascular: Normal rate, regular rhythm and intact distal pulses. No  M/R/G Pulmonary/chest: Effort normal and breath sounds normal. No wheezing, rales or rhonchi. Abdominal: Soft, nontender, nondistended. Bowel sounds active throughout. Extremities: no clubbing, cyanosis, or edema Neurological: Alert and oriented to person place and time. Skin: Skin is warm and dry.  Psychiatric: Normal mood and affect. Behavior is normal.  CBC    Component Value Date/Time   WBC 5.7 07/11/2014 1428   WBC 6.4 07/10/2013 1419   RBC 3.85 07/11/2014 1428   RBC 3.75* 07/10/2013 1419   HGB 11.1* 07/11/2014 1428   HGB 11.2* 07/10/2013 1419   HCT 34.7* 07/11/2014 1428   HCT 34.4* 07/10/2013 1419   PLT 186 07/11/2014 1428   PLT 180 07/10/2013 1419   MCV 90.1 07/11/2014 1428   MCV 92 07/10/2013 1419   MCH 28.9 07/11/2014 1428   MCH 29.9 07/10/2013 1419   MCHC 32.1 07/11/2014 1428   MCHC 32.6 07/10/2013 1419   RDW 13.6 07/11/2014 1428   RDW 13.4 07/10/2013 1419   LYMPHSABS 0.9* 07/11/2014 1428   LYMPHSABS 1.0 07/10/2013 1419   MONOABS 0.5 07/11/2014 1428   MONOABS 0.5 07/10/2013 1419   EOSABS 0.2 07/11/2014 1428   EOSABS 0.2 07/10/2013 1419   BASOSABS 0.1 07/11/2014 1428   BASOSABS 0.1 07/10/2013 1419    CMP     Component Value Date/Time   NA 139 05/11/2012 1255   K 3.7 05/11/2012 1255   CL 107 05/11/2012 1255   CO2 28 05/11/2012 1255   GLUCOSE 100* 05/11/2012 1255   BUN 41* 05/11/2012 1255   CREATININE 2.85* 07/11/2014 1428   CREATININE 2.53* 07/10/2013 1419   CALCIUM 9.1 05/11/2012 1255   PROT 7.3 07/11/2014 1428  PROT 7.3 07/10/2013 1419   ALBUMIN 4.2 07/11/2014 1428   ALBUMIN 3.8 07/10/2013 1419   AST 18 07/11/2014 1428   AST 13* 07/10/2013 1419   ALT 15 07/11/2014 1428   ALT 20 07/10/2013 1419   ALKPHOS 46 07/11/2014 1428   ALKPHOS 58 07/10/2013 1419   BILITOT 0.4 07/11/2014 1428   BILITOT 0.2 07/10/2013 1419   GFRNONAA 15* 07/11/2014 1428   GFRNONAA 18* 07/10/2013 1419   GFRNONAA 19* 03/23/2011 1433   GFRAA 17* 07/11/2014 1428   GFRAA 21*  07/10/2013 1419   GFRAA 23* 03/23/2011 1433        Assessment & Plan:   79 year old female with history of renal cell carcinoma status post left nephrectomy in 2002, partial right nephrectomy in 2004, chronic kidney disease, normocytic anemia, history of breast cancer, hypertension and hyperlipidemia who seen for follow-up.  1. Intermittent loose stools -- overall her loose stools and diarrhea has been well controlled though she continues to have episodes which can be bothersome to her. There are no new alarm symptoms. She seems to have benefited from lactose-free diet. I recommended Lactaid tablets over-the-counter for any lactose-containing meal. Given her crampy symptoms in her lower abdomen which can proceed diarrhea I will prescribe Bentyl 20 mg 3 times a day when necessary. Lomotil can still be used 4 times a day as needed for diarrhea. She is needing this very rarely. Of note there was nonspecific inflammation seen on colonoscopy in 2015 but not meeting criteria for microscopic colitis or IBD.   2. History of chronic and active gastritis -- no gastric symptoms currently. Will try to reduce PPI to pantoprazole 29 g daily. Notify me if this causes any change in symptoms.   Six-month follow-up, earlier if needed

## 2014-12-03 NOTE — Patient Instructions (Addendum)
We have sent the following medications to your pharmacy for you to pick up at your convenience: Pantoprazole 20 mg daily (decrease from 40 mg daily) Dicyclomine 20 mg three times daily as needed for crampy abdominal pain  Please purchase the following medications over the counter and take as directed: Lactaid-Take with meals as needed  Continue Lomotil as needed.  Please follow up with Dr Hilarie Fredrickson in 6 months.

## 2015-03-04 ENCOUNTER — Other Ambulatory Visit: Payer: Self-pay | Admitting: Family Medicine

## 2015-03-04 DIAGNOSIS — Z853 Personal history of malignant neoplasm of breast: Secondary | ICD-10-CM

## 2015-03-25 ENCOUNTER — Encounter: Payer: Self-pay | Admitting: Emergency Medicine

## 2015-03-25 ENCOUNTER — Emergency Department
Admission: EM | Admit: 2015-03-25 | Discharge: 2015-03-25 | Disposition: A | Discharge status: Home / Self Care | Payer: Medicare Other | Attending: Emergency Medicine | Admitting: Emergency Medicine

## 2015-03-25 DIAGNOSIS — N189 Chronic kidney disease, unspecified: Secondary | ICD-10-CM | POA: Diagnosis not present

## 2015-03-25 DIAGNOSIS — Y9289 Other specified places as the place of occurrence of the external cause: Secondary | ICD-10-CM | POA: Insufficient documentation

## 2015-03-25 DIAGNOSIS — Z7982 Long term (current) use of aspirin: Secondary | ICD-10-CM | POA: Diagnosis not present

## 2015-03-25 DIAGNOSIS — I129 Hypertensive chronic kidney disease with stage 1 through stage 4 chronic kidney disease, or unspecified chronic kidney disease: Secondary | ICD-10-CM | POA: Insufficient documentation

## 2015-03-25 DIAGNOSIS — S0181XA Laceration without foreign body of other part of head, initial encounter: Secondary | ICD-10-CM | POA: Diagnosis not present

## 2015-03-25 DIAGNOSIS — Y9389 Activity, other specified: Secondary | ICD-10-CM | POA: Diagnosis not present

## 2015-03-25 DIAGNOSIS — Y998 Other external cause status: Secondary | ICD-10-CM | POA: Insufficient documentation

## 2015-03-25 DIAGNOSIS — W1839XA Other fall on same level, initial encounter: Secondary | ICD-10-CM | POA: Diagnosis not present

## 2015-03-25 DIAGNOSIS — Z79899 Other long term (current) drug therapy: Secondary | ICD-10-CM | POA: Diagnosis not present

## 2015-03-25 MED ORDER — LIDOCAINE-EPINEPHRINE (PF) 1 %-1:200000 IJ SOLN
INTRAMUSCULAR | Status: AC
  Filled 2015-03-25: qty 30

## 2015-03-25 NOTE — Discharge Instructions (Signed)
Facial Laceration ° A facial laceration is a cut on the face. These injuries can be painful and cause bleeding. Lacerations usually heal quickly, but they need special care to reduce scarring. °DIAGNOSIS  °Your health care provider will take a medical history, ask for details about how the injury occurred, and examine the wound to determine how deep the cut is. °TREATMENT  °Some facial lacerations may not require closure. Others may not be able to be closed because of an increased risk of infection. The risk of infection and the chance for successful closure will depend on various factors, including the amount of time since the injury occurred. °The wound may be cleaned to help prevent infection. If closure is appropriate, pain medicines may be given if needed. Your health care provider will use stitches (sutures), wound glue (adhesive), or skin adhesive strips to repair the laceration. These tools bring the skin edges together to allow for faster healing and a better cosmetic outcome. If needed, you may also be given a tetanus shot. °HOME CARE INSTRUCTIONS °· Only take over-the-counter or prescription medicines as directed by your health care provider. °· Follow your health care provider's instructions for wound care. These instructions will vary depending on the technique used for closing the wound. °For Sutures: °· Keep the wound clean and dry.   °· If you were given a bandage (dressing), you should change it at least once a day. Also change the dressing if it becomes wet or dirty, or as directed by your health care provider.   °· Wash the wound with soap and water 2 times a day. Rinse the wound off with water to remove all soap. Pat the wound dry with a clean towel.   °· After cleaning, apply a thin layer of the antibiotic ointment recommended by your health care provider. This will help prevent infection and keep the dressing from sticking.   °· You may shower as usual after the first 24 hours. Do not soak the  wound in water until the sutures are removed.   °· Get your sutures removed as directed by your health care provider. With facial lacerations, sutures should usually be taken out after 4-5 days to avoid stitch marks.   °· Wait a few days after your sutures are removed before applying any makeup. °For Skin Adhesive Strips: °· Keep the wound clean and dry.   °· Do not get the skin adhesive strips wet. You may bathe carefully, using caution to keep the wound dry.   °· If the wound gets wet, pat it dry with a clean towel.   °· Skin adhesive strips will fall off on their own. You may trim the strips as the wound heals. Do not remove skin adhesive strips that are still stuck to the wound. They will fall off in time.   °For Wound Adhesive: °· You may briefly wet your wound in the shower or bath. Do not soak or scrub the wound. Do not swim. Avoid periods of heavy sweating until the skin adhesive has fallen off on its own. After showering or bathing, gently pat the wound dry with a clean towel.   °· Do not apply liquid medicine, cream medicine, ointment medicine, or makeup to your wound while the skin adhesive is in place. This may loosen the film before your wound is healed.   °· If a dressing is placed over the wound, be careful not to apply tape directly over the skin adhesive. This may cause the adhesive to be pulled off before the wound is healed.   °· Avoid   prolonged exposure to sunlight or tanning lamps while the skin adhesive is in place. °· The skin adhesive will usually remain in place for 5-10 days, then naturally fall off the skin. Do not pick at the adhesive film.   °After Healing: °Once the wound has healed, cover the wound with sunscreen during the day for 1 full year. This can help minimize scarring. Exposure to ultraviolet light in the first year will darken the scar. It can take 1-2 years for the scar to lose its redness and to heal completely.  °SEEK MEDICAL CARE IF: °· You have a fever. °SEEK IMMEDIATE  MEDICAL CARE IF: °· You have redness, pain, or swelling around the wound.   °· You see a yellowish-white fluid (pus) coming from the wound.   °  °This information is not intended to replace advice given to you by your health care provider. Make sure you discuss any questions you have with your health care provider. °  °Document Released: 01/30/2004 Document Revised: 01/12/2014 Document Reviewed: 08/04/2012 °Elsevier Interactive Patient Education ©2016 [employer] Inc[employer] ° °Head Injury, Adult °You have a head injury. Headaches and throwing up (vomiting) are common after a head injury. It should be easy to wake up from sleeping. Sometimes you must stay in the hospital. Most problems happen within the first 24 hours. Side effects may occur up to 7-10 days after the injury.  °WHAT ARE THE TYPES OF HEAD INJURIES? °Head injuries can be as minor as a bump. Some head injuries can be more severe. More severe head injuries include: °· A jarring injury to the brain (concussion). °· A bruise of the brain (contusion). This mean there is bleeding in the brain that can cause swelling. °· A cracked skull (skull fracture). °· Bleeding in the brain that collects, clots, and forms a bump (hematoma). °WHEN SHOULD I GET HELP RIGHT AWAY?  °· You are confused or sleepy. °· You cannot be woken up. °· You feel sick to your stomach (nauseous) or keep throwing up (vomiting). °· Your dizziness or unsteadiness is getting worse. °· You have very bad, lasting headaches that are not helped by medicine. Take medicines only as told by your doctor. °· You cannot use your arms or legs like normal. °· You cannot walk. °· You notice changes in the black spots in the center of the colored part of your eye (pupil). °· You have clear or bloody fluid coming from your nose or ears. °· You have trouble seeing. °During the next 24 hours after the injury, you must stay with someone who can watch you. This person should get help right away (call 911 in the [place] if  you start to shake and are not able to control it (have seizures), you pass out, or you are unable to wake up. °HOW CAN I PREVENT A HEAD INJURY IN THE FUTURE? °· Wear seat belts. °· Wear a helmet while bike riding and playing sports like football. °· Stay away from dangerous activities around the house. °WHEN CAN I RETURN TO NORMAL ACTIVITIES AND ATHLETICS? °See your doctor before doing these activities. You should not do normal activities or play contact sports until 1 week after the following symptoms have stopped: °· Headache that does not go away. °· Dizziness. °· Poor attention. °· Confusion. °· Memory problems. °· Sickness to your stomach or throwing up. °· Tiredness. °· Fussiness. °· Bothered by bright lights or loud noises. °· Anxiousness or depression. °· Restless sleep. °MAKE SURE YOU:  °· Understand these instructions. °· Will   watch your condition. °· Will get help right away if you are not doing well or get worse. °  °This information is not intended to replace advice given to you by your health care provider. Make sure you discuss any questions you have with your health care provider. °  °Document Released: 12/05/2007 Document Revised: 01/12/2014 Document Reviewed: 08/29/2012 °Elsevier Interactive Patient Education ©2016 Elsevier Inc. ° °

## 2015-03-25 NOTE — ED Notes (Signed)
Pt discharged home after verbalizing understanding of discharge instructions; nad noted. 

## 2015-03-25 NOTE — ED Notes (Addendum)
Pt reports that she tripped in the driveway and fell. She has laceration to right eye and swollen nose. Bleeding controlled at this time.

## 2015-03-25 NOTE — ED Provider Notes (Signed)
[facility] Emergency Department Provider Note ____________________________________________  Time seen: Approximately 4:11 PM  I have reviewed the triage vital signs and the nursing notes.   HISTORY  Chief Complaint Head Laceration   HPI Erin Mccormick is a 80 y.o. female who presents to the emergency department for evaluation of a laceration in the right eyebrow. She experienced a mechanical, non-syncopal fall while in her driveway this prior to arrival. She states that she tripped over the sidewalk and hit her face. She denies nausea, vomiting, confusion, vision changes, epistaxis, or any other symptom of concern. She is not on anticoagulants.   Past Medical History  Diagnosis Date  . Chronic gastritis   . Renal cell carcinoma ([place])     left  . CKD (chronic kidney disease)   . Hypertension   . Anemia   . Hyperlipidemia   . Colon polyp   . Breast cancer ([place]) left     NO BLOOD PRESSURES OR STICK IN LEFT ARM  . Depression   . Gastric ulcer   . Osteoporosis   . Gout   . Hyperlipidemia     There are no active problems to display for this patient.   Past Surgical History  Procedure Laterality Date  . Cholecystectomy    . Nephrectomy Left   . Thyroid surgery    . Lumbar laminectomy    . Mastectomy, partial Left   . Kidney surgery Right   . Tonsillectomy      Current Outpatient Rx  Name  Route  Sig  Dispense  Refill  . amLODipine (NORVASC) 10 MG tablet   Oral   Take 10 mg by mouth daily.         Marland Kitchen anastrozole (ARIMIDEX) 1 MG tablet   Oral   Take 1 mg by mouth daily.         Marland Kitchen aspirin 81 MG tablet   Oral   Take 81 mg by mouth daily.         . Cholecalciferol (VITAMIN D) 2000 UNITS CAPS   Oral   Take 2,000 capsules by mouth daily.         Marland Kitchen dicyclomine (BENTYL) 20 MG tablet   Oral   Take 1 tablet (20 mg total) by mouth 3 (three) times daily as needed for spasms.   90 tablet   2   . diphenoxylate-atropine (LOMOTIL)  2.5-0.025 MG per tablet   Oral   Take 1 tablet by mouth 4 (four) times daily as needed for diarrhea or loose stools.   60 tablet   1   . furosemide (LASIX) 40 MG tablet   Oral   Take 40 mg by mouth daily.         Marland Kitchen levothyroxine (SYNTHROID, LEVOTHROID) 88 MCG tablet   Oral   Take 88 mcg by mouth daily.         . metoprolol (TOPROL-XL) 200 MG 24 hr tablet   Oral   Take 200 mg by mouth daily.         . pantoprazole (PROTONIX) 20 MG tablet   Oral   Take 1 tablet (20 mg total) by mouth daily.   30 tablet   5   . PARoxetine (PAXIL) 10 MG tablet   Oral   Take 10 mg by mouth daily.         . pravastatin (PRAVACHOL) 20 MG tablet   Oral   Take 20 mg by mouth daily.  Allergies Morphine; Ace inhibitors; Morphine and related; Allopurinol; and Other  Family History  Problem Relation Age of Onset  . Colon cancer Neg Hx   . Lung cancer Father   . Kidney disease Neg Hx     Social History Social History  Substance Use Topics  . Smoking status: Never Smoker   . Smokeless tobacco: None  . Alcohol Use: No    Review of Systems Constitutional: No fever/chills Eyes: No visual changes. ENT: Positive for nasal bridge swelling with ecchymosis under the right eye Respiratory: Denies shortness of breath. Gastrointestinal:No nausea, no vomiting. Musculoskeletal: Negative for back pain. Negative for neck pain Skin: Positive for laceration in the right eyebrow toward the nasal side with associated ecchymosis Neurological: Negative for headaches, focal weakness or numbness.  10-point ROS otherwise negative.  ____________________________________________   PHYSICAL EXAM:  VITAL SIGNS: ED Triage Vitals  Enc Vitals Group     BP 03/25/15 1402 140/53 mmHg     Pulse Rate 03/25/15 1402 57     Resp 03/25/15 1402 20     Temp 03/25/15 1402 98.2 F (36.8 C)     Temp Source 03/25/15 1402 Oral     SpO2 03/25/15 1402 100 %     Weight 03/25/15 1402 123 lb (55.792 kg)      Height --      Head Cir --      Peak Flow --      Pain Score 03/25/15 1402 5     Pain Loc --      Pain Edu? --      Excl. in [place]? --     Constitutional: Alert and oriented. Well appearing and in no acute distress. Eyes: Conjunctivae are normal. PERRL. EOMI. Head: Atraumatic. Nose: Generalized edema noted without epistaxis Mouth/Throat: No trauma noted. No malocclusion. Neck: No stridor. Nexus criteria negative Respiratory: Normal respiratory effort.  No retractions.  Musculoskeletal: Active range of motion observed 4 Neurologic:  Normal speech and language. No gross focal neurologic deficits are appreciated. No gait instability. Skin: 2.5 cm laceration to the right eyebrow Psychiatric: Mood and affect are normal. Speech and behavior are normal.  ____________________________________________   LABS (all labs ordered are listed, but only abnormal results are displayed)  Labs Reviewed - No data to display ____________________________________________  EKG   ____________________________________________  RADIOLOGY  Not indicated ____________________________________________   PROCEDURES  Procedure(s) performed:  LACERATION REPAIR Performed by: Sherrie George Authorized by: Sherrie George Consent: Verbal consent obtained. Risks and benefits: risks, benefits and alternatives were discussed Consent given by: patient Patient identity confirmed: provided demographic data Prepped and Draped in normal sterile fashion Wound explored  Laceration Location: right eyebrow  Laceration Length: 2.5cm  No Foreign Bodies seen or palpated  Anesthesia: local infiltration  Local anesthetic: lidocaine 1% with epinephrine  Anesthetic total: 3 ml  Irrigation method: syringe Amount of cleaning: standard  Skin closure: 6-0 Prolene  Number of sutures: 4  Technique: Simple interrupted  Patient tolerance: Patient tolerated the procedure well with no immediate  complications.   Critical Care performed: No  ____________________________________________   INITIAL IMPRESSION / ASSESSMENT AND PLAN / ED COURSE  Pertinent labs & imaging results that were available during my care of the patient were reviewed by me and considered in my medical decision making (see chart for details).  Patient was advised follow-up with her primary care provider in 5 days for suture removal. Wound care was discussed. She and her husband were given head injury instructions as well.  She is to return to the emergency department for any symptom of concern if unable to see her primary care provider. ____________________________________________   FINAL CLINICAL IMPRESSION(S) / ED DIAGNOSES  Final diagnoses:  Laceration of face, initial encounter      Victorino Dike, FNP 03/25/15 [place], MD 03/25/15 2010

## 2015-03-25 NOTE — ED Notes (Addendum)
Pt to ed with c/o fall today.  Pt reports she tripped on cement and hit her head.  Pt with laceration to right eyebrow, bleeding controlled, approx 1 inch.  Bandage applied,  Bruising noted to right eye, and bruising and swelling noted to pt nose.  Pt denies loss of consciousness during the fall.

## 2015-04-08 ENCOUNTER — Ambulatory Visit: Payer: Medicare Other

## 2015-04-08 ENCOUNTER — Other Ambulatory Visit: Payer: Medicare Other

## 2015-04-23 ENCOUNTER — Ambulatory Visit
Admission: RE | Admit: 2015-04-23 | Discharge: 2015-04-23 | Disposition: A | Discharge status: Home / Self Care | Payer: Medicare Other | Source: Ambulatory Visit | Attending: Family Medicine | Admitting: Family Medicine

## 2015-04-23 DIAGNOSIS — Z853 Personal history of malignant neoplasm of breast: Secondary | ICD-10-CM | POA: Diagnosis not present

## 2015-05-03 ENCOUNTER — Encounter: Payer: Self-pay | Admitting: Internal Medicine

## 2015-05-03 ENCOUNTER — Ambulatory Visit (INDEPENDENT_AMBULATORY_CARE_PROVIDER_SITE_OTHER): Payer: Medicare Other | Admitting: Internal Medicine

## 2015-05-03 VITALS — BP 128/56 | HR 60 | Ht 65.0 in | Wt 121.2 lb

## 2015-05-03 DIAGNOSIS — Z8719 Personal history of other diseases of the digestive system: Secondary | ICD-10-CM

## 2015-05-03 DIAGNOSIS — K589 Irritable bowel syndrome without diarrhea: Secondary | ICD-10-CM | POA: Diagnosis not present

## 2015-05-03 DIAGNOSIS — R195 Other fecal abnormalities: Secondary | ICD-10-CM | POA: Diagnosis not present

## 2015-05-03 MED ORDER — PANTOPRAZOLE SODIUM 20 MG PO TBEC: 20.0000 mg | DELAYED_RELEASE_TABLET | Freq: Every day | ORAL | Status: DC

## 2015-05-03 NOTE — Patient Instructions (Signed)
Continue pantoprazole 20 mg daily.  Discontinue dicyclomine and lomotil.  Make sure Dr Amalia Hailey @ [facility] is okay with you taking pantoprazole daily. If not, please call and let us know.  Please follow up with Dr Hilarie Fredrickson in 1 year.  If you are age 78 or older, your body mass index should be between 23-30. Your Body mass index is 20.18 kg/(m^2). If this is out of the aforementioned range listed, please consider follow up with your Primary Care Provider.  If you are age 57 or younger, your body mass index should be between 19-25. Your Body mass index is 20.18 kg/(m^2). If this is out of the aformentioned range listed, please consider follow up with your Primary Care Provider.

## 2015-05-03 NOTE — Progress Notes (Signed)
   Subjective:    Patient ID: Erin Mccormick, female    DOB: Jul 08, 1935, 80 y.o.   MRN: [id number]  HPI Erin Mccormick is a 80 year old female with a history of renal cell carcinoma status post nephrectomy in 2002, partial right nephrectomy in 2004, chronic kidney disease, normocytic anemia, history of breast cancer, hypertension, hyperlipidemia who is seen in follow-up for her history of intermittent loose stools and history of gastritis. She is here alone today and was last seen in November 2016.  She reports that she was fairly recently started on PhosLo because her phosphorus was elevated by Dr. Amalia Hailey, her nephrologist at [facility]. This has completely resolved her intermittent chronic loose stools and lower abdominal discomfort. She is now having a formed bowel movement every day to every other day. She's had no blood in her stool or melena. Occasionally if she eats very spicy food she has midabdominal discomfort which is not severe. She is tried to increase the fluid in her diet to remain hydrated. She has not needed Bentyl or Lomotil. She has continued pantoprazole which we reduced to 20 mg daily after her last visit. She's noticed no change in symptoms with this reduction.   Review of Systems As per history of present illness, otherwise negative  Current Medications, Allergies, Past Medical History, Past Surgical History, Family History and Social History were reviewed in [employer] record.     Objective:   Physical Exam BP 128/56 mmHg  Pulse 60  Ht 5\' 5"  (1.651 m)  Wt 121 lb 4 oz (54.999 kg)  BMI 20.18 kg/m2 Constitutional: Well-developed and well-nourished. No distress. HEENT: Normocephalic and atraumatic. Conjunctivae are normal.  No scleral icterus. Neck: Neck supple. Trachea midline. Cardiovascular: Normal rate, regular rhythm and intact distal pulses. No M/R/G Pulmonary/chest: Effort normal and breath sounds normal. No wheezing, rales or rhonchi. Abdominal:  Soft, thin, nontender, nondistended. Bowel sounds active throughout.  Extremities: no clubbing, cyanosis, or edema Neurological: Alert and oriented to person place and time. Skin: Skin is warm and dry. No rashes noted. Psychiatric: Normal mood and affect. Behavior is normal.     Assessment & Plan:  80 year old female with a history of renal cell carcinoma status post nephrectomy in 2002, partial right nephrectomy in 2004, chronic kidney disease, normocytic anemia, history of breast cancer, hypertension, hyperlipidemia who is seen in follow-up for her history of intermittent loose stools and history of gastritis.  1. Intermittent loose stools -- dramatic improvement with the addition of PhosLo as a phosphorus binder. She has not required Bentyl or Lomotil. We'll discontinue both of these medications. Notify me if recurrent loose stools or diarrhea occurs. Previous colonoscopy in 2015 did show nonspecific inflammation not meeting criteria for IBD or microscopic colitis.  2. History of chronic active gastritis -- she tolerated decreasing PPI in the form of pantoprazole to 20 mg daily. No current dyspepsia or reflux symptoms. We have discussed chronic PPI therapy today and I have asked that she discuss this with her nephrologist Dr. Amalia Hailey. We will plan to continue 20 mg daily unless Dr. Amalia Hailey recommends complete PPI avoidance. She is asked to notify me if this is the case.  One year follow-up, sooner if necessary 15 minutes spent with the patient today. Greater than 50% was spent in counseling and coordination of care with the patient

## 2015-07-18 ENCOUNTER — Other Ambulatory Visit: Payer: Self-pay | Admitting: Internal Medicine

## 2015-08-05 ENCOUNTER — Emergency Department: Payer: Medicare Other

## 2015-08-05 ENCOUNTER — Emergency Department
Admission: EM | Admit: 2015-08-05 | Discharge: 2015-08-05 | Disposition: A | Discharge status: Home / Self Care | Payer: Medicare Other | Attending: Emergency Medicine | Admitting: Emergency Medicine

## 2015-08-05 ENCOUNTER — Encounter: Payer: Self-pay | Admitting: Emergency Medicine

## 2015-08-05 DIAGNOSIS — Z853 Personal history of malignant neoplasm of breast: Secondary | ICD-10-CM | POA: Diagnosis not present

## 2015-08-05 DIAGNOSIS — R197 Diarrhea, unspecified: Secondary | ICD-10-CM

## 2015-08-05 DIAGNOSIS — R1013 Epigastric pain: Secondary | ICD-10-CM

## 2015-08-05 DIAGNOSIS — N189 Chronic kidney disease, unspecified: Secondary | ICD-10-CM | POA: Insufficient documentation

## 2015-08-05 DIAGNOSIS — I129 Hypertensive chronic kidney disease with stage 1 through stage 4 chronic kidney disease, or unspecified chronic kidney disease: Secondary | ICD-10-CM | POA: Insufficient documentation

## 2015-08-05 DIAGNOSIS — K858 Other acute pancreatitis without necrosis or infection: Secondary | ICD-10-CM | POA: Diagnosis not present

## 2015-08-05 LAB — URINALYSIS COMPLETE WITH MICROSCOPIC (ARMC ONLY)
BILIRUBIN URINE: NEGATIVE
Glucose, UA: NEGATIVE mg/dL
Ketones, ur: NEGATIVE mg/dL
LEUKOCYTES UA: NEGATIVE
NITRITE: NEGATIVE
PH: 6 (ref 5.0–8.0)
PROTEIN: 100 mg/dL — AB
RBC / HPF: NONE SEEN RBC/hpf (ref 0–5)
SQUAMOUS EPITHELIAL / LPF: NONE SEEN
Specific Gravity, Urine: 1.004 — ABNORMAL LOW (ref 1.005–1.030)

## 2015-08-05 LAB — LIPASE, BLOOD: LIPASE: 107 U/L — AB (ref 11–51)

## 2015-08-05 LAB — COMPREHENSIVE METABOLIC PANEL
ALT: 61 U/L — ABNORMAL HIGH (ref 14–54)
ANION GAP: 9 (ref 5–15)
AST: 98 U/L — AB (ref 15–41)
Albumin: 4.3 g/dL (ref 3.5–5.0)
Alkaline Phosphatase: 110 U/L (ref 38–126)
BILIRUBIN TOTAL: 0.7 mg/dL (ref 0.3–1.2)
BUN: 47 mg/dL — AB (ref 6–20)
CHLORIDE: 105 mmol/L (ref 101–111)
CO2: 26 mmol/L (ref 22–32)
Calcium: 9.6 mg/dL (ref 8.9–10.3)
Creatinine, Ser: 2.63 mg/dL — ABNORMAL HIGH (ref 0.44–1.00)
GFR calc Af Amer: 19 mL/min — ABNORMAL LOW (ref >60)
GFR, EST NON AFRICAN AMERICAN: 16 mL/min — AB (ref >60)
Glucose, Bld: 90 mg/dL (ref 65–99)
POTASSIUM: 3.5 mmol/L (ref 3.5–5.1)
Sodium: 140 mmol/L (ref 135–145)
TOTAL PROTEIN: 7.1 g/dL (ref 6.5–8.1)

## 2015-08-05 LAB — TROPONIN I

## 2015-08-05 LAB — CBC
HEMATOCRIT: 33.3 % — AB (ref 35.0–47.0)
HEMOGLOBIN: 11.5 g/dL — AB (ref 12.0–16.0)
MCH: 30.7 pg (ref 26.0–34.0)
MCHC: 34.6 g/dL (ref 32.0–36.0)
MCV: 88.7 fL (ref 80.0–100.0)
Platelets: 201 10*3/uL (ref 150–440)
RBC: 3.76 MIL/uL — AB (ref 3.80–5.20)
RDW: 13.8 % (ref 11.5–14.5)
WBC: 8.3 10*3/uL (ref 3.6–11.0)

## 2015-08-05 LAB — LACTIC ACID, PLASMA: LACTIC ACID, VENOUS: 0.5 mmol/L (ref 0.5–1.9)

## 2015-08-05 MED ORDER — DIATRIZOATE MEGLUMINE & SODIUM 66-10 % PO SOLN
15.0000 mL | ORAL | Status: AC
  Administered 2015-08-05: 30 mL via ORAL

## 2015-08-05 MED ORDER — FENTANYL CITRATE (PF) 100 MCG/2ML IJ SOLN
50.0000 ug | Freq: Once | INTRAMUSCULAR | Status: AC
  Administered 2015-08-05: 50 ug via INTRAVENOUS
  Filled 2015-08-05: qty 2

## 2015-08-05 MED ORDER — SODIUM CHLORIDE 0.9 % IV BOLUS (SEPSIS)
1000.0000 mL | Freq: Once | INTRAVENOUS | Status: AC
  Administered 2015-08-05: 1000 mL via INTRAVENOUS

## 2015-08-05 MED ORDER — TRAMADOL HCL 50 MG PO TABS: 50.0000 mg | ORAL_TABLET | Freq: Four times a day (QID) | ORAL | 0 refills | Status: DC | PRN

## 2015-08-05 MED ORDER — ONDANSETRON 4 MG PO TBDP: 4.0000 mg | ORAL_TABLET | Freq: Four times a day (QID) | ORAL | 0 refills | Status: DC | PRN

## 2015-08-05 NOTE — Discharge Instructions (Signed)

## 2015-08-05 NOTE — ED Notes (Signed)
CT completed

## 2015-08-05 NOTE — ED Provider Notes (Signed)
-----------------------------------------   9:25 AM on 08/05/2015 -----------------------------------------  Patient reports feeling improved. Currently reports a 2 out of 10 discomfort in the upper abdomen. She overall reports she feels much much better. She is comfortable going home. Return precautions and treatment recommendations and follow-up discussed with the patient who is agreeable with the plan.   Ct Abdomen Pelvis Wo Contrast  Result Date: 08/05/2015 CLINICAL DATA:  Abdominal pain and diarrhea. EXAM: CT ABDOMEN AND PELVIS WITHOUT CONTRAST TECHNIQUE: Multidetector CT imaging of the abdomen and pelvis was performed following the standard protocol without IV contrast. COMPARISON:  April 21, 2012 FINDINGS: The lung bases are within normal limits. No free air or free fluid. The patient is status post left nephrectomy. No abnormal soft tissue is seen in the nephrectomy bed. The right kidney demonstrates a rounded region of increased attenuation laterally, probably a hyperdense cyst but too small to characterize. No hydronephrosis or perinephric stranding. An extrarenal pelvis is identified, unchanged. No right ureterectasis or ureteral stones. The patient is status post cholecystectomy. The liver, spleen, adrenal glands, and pancreas are within normal limits. Atherosclerosis is seen in the non aneurysmal aorta. No adenopathy. The stomach and small bowel are normal. The colon is poorly evaluated due the lack of distention with oral contrast from the transverse colon to the rectum. There is contrast in the cecum and ascending colon. No colonic abnormalities are identified. The appendix is not seen but there is no secondary evidence of appendicitis. The bladder is within normal limits. No suspicious masses or adenopathy seen in the pelvis. Visualized bones demonstrate degenerative change in the spine with no acute bony abnormalities. IMPRESSION: 1. No cause for the patient's pain identified. No acute  abnormalities. Electronically Signed   By: Dorise Bullion III M.D   On: 08/05/2015 08:16  CT scan unremarkable. Mildly elevated lipase. The patient appears well-hydrated at her baseline creatinine. Normal mental status. No nausea or vomiting, resting comfortably and reports her pain is well-controlled. She has a mildly elevated LFTs, but reports that her gallbladder was removed several years ago and CT does not note any acute findings to suggest choledocholithiasis or pancreatitis by imaging.  I discussed carefully with her and her husband return precautions. She is very agreeable.    Delman Kitten, MD 08/05/15 [phone]

## 2015-08-05 NOTE — ED Provider Notes (Signed)
[facility] Emergency Department Provider Note   ____________________________________________   First MD Initiated Contact with Patient 08/05/15 [phone]     (approximate)  I have reviewed the triage vital signs and the nursing notes.   HISTORY  Chief Complaint Abdominal Pain and Diarrhea    HPI Erin Mccormick is a 80 y.o. female comes into the hospital today with abdominal pain. The patient reports that she has had diarrhea since Wednesday. She reports it got better initially but then returned on Saturday. The patient reports that she was out of town but when she returned home she was having diarrhea up to every 15 minutes. She reports that she took a prescription dose of Lomotil which she has as needed at home. She reports that the diarrhea improved but then started having some pain under her breast and in the middle of her belly. She reports that goes all the way around her belly to her back. The patient later pain a 6 out of 10 in intensity. She denies any nausea or vomiting and describes the pain as a dull ache. She reports that her diarrhea was liquid and jelly like in consistency as well as yellow. The patient was unsure what was causing her symptoms she decided to come into the hospital for evaluation.   Past Medical History:  Diagnosis Date  . Anemia   . Breast cancer ([place]) left    NO BLOOD PRESSURES OR STICK IN LEFT ARM  . Chronic gastritis   . CKD (chronic kidney disease)   . Colon polyp   . Depression   . Gastric ulcer   . Gout   . Hyperlipidemia   . Hyperlipidemia   . Hypertension   . Osteoporosis   . Renal cell carcinoma ([place])    left    There are no active problems to display for this patient.   Past Surgical History:  Procedure Laterality Date  . BREAST EXCISIONAL BIOPSY Right 2011   NEG  . CHOLECYSTECTOMY    . KIDNEY SURGERY Right   . LUMBAR LAMINECTOMY    . MASTECTOMY, PARTIAL Left   . NEPHRECTOMY Left   . THYROID SURGERY      . TONSILLECTOMY      Prior to Admission medications   Medication Sig Start Date End Date Taking? Authorizing Provider  amLODipine (NORVASC) 10 MG tablet Take 10 mg by mouth daily.    Historical Provider, MD  anastrozole (ARIMIDEX) 1 MG tablet Take 1 mg by mouth daily.    Historical Provider, MD  aspirin 81 MG tablet Take 81 mg by mouth daily.    Historical Provider, MD  calcium acetate (PHOSLO) 667 MG capsule Take by mouth 3 (three) times daily with meals.    Historical Provider, MD  Cholecalciferol (VITAMIN D) 2000 UNITS CAPS Take 2,000 capsules by mouth daily.    Historical Provider, MD  furosemide (LASIX) 40 MG tablet Take 40 mg by mouth daily.    Historical Provider, MD  levothyroxine (SYNTHROID, LEVOTHROID) 88 MCG tablet Take 88 mcg by mouth daily.    Historical Provider, MD  metoprolol (TOPROL-XL) 200 MG 24 hr tablet Take 200 mg by mouth daily.    Historical Provider, MD  pantoprazole (PROTONIX) 20 MG tablet Take 1 tablet (20 mg total) by mouth daily. 05/03/15   Jerene Bears, MD  PARoxetine (PAXIL) 10 MG tablet Take 10 mg by mouth daily.    Historical Provider, MD  pravastatin (PRAVACHOL) 20 MG tablet Take 20 mg  by mouth daily.    Historical Provider, MD    Allergies Morphine; Ace inhibitors; Morphine and related; Allopurinol; and Other  Family History  Problem Relation Age of Onset  . Lung cancer Father   . Colon cancer Neg Hx   . Kidney disease Neg Hx   . Breast cancer Neg Hx     Social History Social History  Substance Use Topics  . Smoking status: Never Smoker  . Smokeless tobacco: Never Used  . Alcohol use No    Review of Systems Constitutional: No fever/chills Eyes: No visual changes. ENT: No sore throat. Cardiovascular: Denies chest pain. Respiratory: Denies shortness of breath. Gastrointestinal:  abdominal pain And diarrhea No nausea, no vomiting.   No constipation. Genitourinary: Negative for dysuria. Musculoskeletal:  back pain. Skin: Negative for  rash. Neurological: Negative for headaches, focal weakness or numbness.  10-point ROS otherwise negative.  ____________________________________________   PHYSICAL EXAM:  VITAL SIGNS: ED Triage Vitals  Enc Vitals Group     BP 08/05/15 0225 (!) 130/106     Pulse Rate 08/05/15 0225 (!) 54     Resp 08/05/15 0225 18     Temp 08/05/15 0225 97.7 F (36.5 C)     Temp Source 08/05/15 0225 Oral     SpO2 08/05/15 0225 99 %     Weight 08/05/15 0225 113 lb (51.3 kg)     Height 08/05/15 0225 5\' 6"  (1.676 m)     Head Circumference --      Peak Flow --      Pain Score 08/05/15 0226 8     Pain Loc --      Pain Edu? --      Excl. in [place]? --     Constitutional: Alert and oriented. Well appearing and in mild distress. Eyes: Conjunctivae are normal. PERRL. EOMI. Head: Atraumatic. Nose: No congestion/rhinnorhea. Mouth/Throat: Mucous membranes are moist.  Oropharynx non-erythematous. Cardiovascular: Normal rate, regular rhythm. Grossly normal heart sounds.  Good peripheral circulation. Respiratory: Normal respiratory effort.  No retractions. Lungs CTAB. Gastrointestinal: Soft with epigastric tenderness to palpation No distention. Positive bowel sounds Musculoskeletal: No lower extremity tenderness nor edema.   Neurologic:  Normal speech and language.  Skin:  Skin is warm, dry and intact.  Psychiatric: Mood and affect are normal.   ____________________________________________   LABS (all labs ordered are listed, but only abnormal results are displayed)  Labs Reviewed  LIPASE, BLOOD - Abnormal; Notable for the following:       Result Value   Lipase 107 (*)    All other components within normal limits  COMPREHENSIVE METABOLIC PANEL - Abnormal; Notable for the following:    BUN 47 (*)    Creatinine, Ser 2.63 (*)    AST 98 (*)    ALT 61 (*)    GFR calc non Af Amer 16 (*)    GFR calc Af Amer 19 (*)    All other components within normal limits  CBC - Abnormal; Notable for the following:     RBC 3.76 (*)    Hemoglobin 11.5 (*)    HCT 33.3 (*)    All other components within normal limits  TROPONIN I  LACTIC ACID, PLASMA  URINALYSIS COMPLETEWITH MICROSCOPIC ([facility] ONLY)  LACTIC ACID, PLASMA   ____________________________________________  EKG  ED ECG REPORT I, Loney Hering, the attending physician, personally viewed and interpreted this ECG.   Date: 08/05/2015  EKG Time: 231  Rate: 55  Rhythm: sinus bradycardia  Axis:  normal  Intervals:none  ST&T Change: none  ____________________________________________  RADIOLOGY  CT abd and pelvis pending ____________________________________________   PROCEDURES  Procedure(s) performed: None  Procedures  Critical Care performed: No  ____________________________________________   INITIAL IMPRESSION / ASSESSMENT AND PLAN / ED COURSE  Pertinent labs & imaging results that were available during my care of the patient were reviewed by me and considered in my medical decision making (see chart for details).  This is a 80 year old female who comes into the hospital today with some abdominal pain as well as diarrhea. The patient reports that the pain is in her epigastric area. The patient has had a cholecystectomy in the past but we will check some blood work. I'll give the patient a dose of Dilaudid as well as a liter of normal saline. Once I received the results of the patient's blood work I will then decide if the patient needs any imaging.  The patient's white blood cell count is normal and her comprehensive metabolic panel is also unremarkable. The patient's creatinine is at her baseline. Given the patient's age and her pain I well perform a noncontrasted CT scan study to evaluate for other causes of her diarrhea and pain. The patient's care will be signed out to Dr. Jacqualine Code who will follow up the results of the CT scan and disposition the patient.   Clinical Course      ____________________________________________   FINAL CLINICAL IMPRESSION(S) / ED DIAGNOSES  Final diagnoses:  Other acute pancreatitis  Epigastric pain      NEW MEDICATIONS STARTED DURING THIS VISIT:  New Prescriptions   No medications on file     Note:  This document was prepared using Dragon voice recognition software and may include unintentional dictation errors.    Loney Hering, MD 08/05/15 [phone]

## 2015-08-05 NOTE — ED Triage Notes (Signed)
Patient states that she has had diarrhea times two days. Patient reports that she took medication last night to help the diarrhea. Patient states that she now has upper abd pain radiating to her back. Patient denies nausea/vomiting or fever.

## 2016-05-05 ENCOUNTER — Other Ambulatory Visit: Payer: Self-pay | Admitting: Family Medicine

## 2016-05-05 DIAGNOSIS — Z853 Personal history of malignant neoplasm of breast: Secondary | ICD-10-CM

## 2016-05-25 ENCOUNTER — Ambulatory Visit
Admission: RE | Admit: 2016-05-25 | Discharge: 2016-05-25 | Disposition: A | Discharge status: Home / Self Care | Payer: Medicare Other | Source: Ambulatory Visit | Attending: Family Medicine | Admitting: Family Medicine

## 2016-05-25 DIAGNOSIS — Z853 Personal history of malignant neoplasm of breast: Secondary | ICD-10-CM | POA: Insufficient documentation

## 2016-05-25 HISTORY — DX: Personal history of irradiation: Z92.3

## 2016-07-01 ENCOUNTER — Encounter: Payer: Self-pay | Admitting: Emergency Medicine

## 2016-07-01 ENCOUNTER — Inpatient Hospital Stay
Admission: EM | Admit: 2016-07-01 | Discharge: 2016-07-02 | DRG: 439 | Disposition: A | Discharge status: Home / Self Care | Payer: Medicare Other | Attending: Internal Medicine | Admitting: Internal Medicine

## 2016-07-01 DIAGNOSIS — Z86010 Personal history of colonic polyps: Secondary | ICD-10-CM | POA: Diagnosis not present

## 2016-07-01 DIAGNOSIS — Z885 Allergy status to narcotic agent status: Secondary | ICD-10-CM

## 2016-07-01 DIAGNOSIS — T360X5A Adverse effect of penicillins, initial encounter: Secondary | ICD-10-CM | POA: Diagnosis present

## 2016-07-01 DIAGNOSIS — N184 Chronic kidney disease, stage 4 (severe): Secondary | ICD-10-CM | POA: Diagnosis present

## 2016-07-01 DIAGNOSIS — E785 Hyperlipidemia, unspecified: Secondary | ICD-10-CM | POA: Diagnosis present

## 2016-07-01 DIAGNOSIS — E890 Postprocedural hypothyroidism: Secondary | ICD-10-CM | POA: Diagnosis present

## 2016-07-01 DIAGNOSIS — R1013 Epigastric pain: Secondary | ICD-10-CM | POA: Diagnosis present

## 2016-07-01 DIAGNOSIS — Y92019 Unspecified place in single-family (private) house as the place of occurrence of the external cause: Secondary | ICD-10-CM

## 2016-07-01 DIAGNOSIS — N189 Chronic kidney disease, unspecified: Secondary | ICD-10-CM

## 2016-07-01 DIAGNOSIS — Z923 Personal history of irradiation: Secondary | ICD-10-CM

## 2016-07-01 DIAGNOSIS — Z801 Family history of malignant neoplasm of trachea, bronchus and lung: Secondary | ICD-10-CM

## 2016-07-01 DIAGNOSIS — Z8711 Personal history of peptic ulcer disease: Secondary | ICD-10-CM | POA: Diagnosis not present

## 2016-07-01 DIAGNOSIS — I129 Hypertensive chronic kidney disease with stage 1 through stage 4 chronic kidney disease, or unspecified chronic kidney disease: Secondary | ICD-10-CM | POA: Diagnosis present

## 2016-07-01 DIAGNOSIS — Z85528 Personal history of other malignant neoplasm of kidney: Secondary | ICD-10-CM | POA: Diagnosis not present

## 2016-07-01 DIAGNOSIS — Z7982 Long term (current) use of aspirin: Secondary | ICD-10-CM

## 2016-07-01 DIAGNOSIS — Z905 Acquired absence of kidney: Secondary | ICD-10-CM

## 2016-07-01 DIAGNOSIS — N179 Acute kidney failure, unspecified: Secondary | ICD-10-CM | POA: Diagnosis present

## 2016-07-01 DIAGNOSIS — K853 Drug induced acute pancreatitis without necrosis or infection: Principal | ICD-10-CM | POA: Diagnosis present

## 2016-07-01 DIAGNOSIS — F329 Major depressive disorder, single episode, unspecified: Secondary | ICD-10-CM | POA: Diagnosis present

## 2016-07-01 DIAGNOSIS — M810 Age-related osteoporosis without current pathological fracture: Secondary | ICD-10-CM | POA: Diagnosis present

## 2016-07-01 DIAGNOSIS — R001 Bradycardia, unspecified: Secondary | ICD-10-CM | POA: Diagnosis present

## 2016-07-01 DIAGNOSIS — K219 Gastro-esophageal reflux disease without esophagitis: Secondary | ICD-10-CM | POA: Diagnosis present

## 2016-07-01 DIAGNOSIS — Z888 Allergy status to other drugs, medicaments and biological substances status: Secondary | ICD-10-CM

## 2016-07-01 DIAGNOSIS — Z853 Personal history of malignant neoplasm of breast: Secondary | ICD-10-CM | POA: Diagnosis not present

## 2016-07-01 DIAGNOSIS — Z882 Allergy status to sulfonamides status: Secondary | ICD-10-CM

## 2016-07-01 DIAGNOSIS — K859 Acute pancreatitis without necrosis or infection, unspecified: Secondary | ICD-10-CM | POA: Diagnosis present

## 2016-07-01 DIAGNOSIS — M109 Gout, unspecified: Secondary | ICD-10-CM | POA: Diagnosis present

## 2016-07-01 DIAGNOSIS — Z91048 Other nonmedicinal substance allergy status: Secondary | ICD-10-CM

## 2016-07-01 LAB — CBC
HCT: 30.5 % — ABNORMAL LOW (ref 35.0–47.0)
HEMATOCRIT: 31.2 % — AB (ref 35.0–47.0)
Hemoglobin: 10.7 g/dL — ABNORMAL LOW (ref 12.0–16.0)
Hemoglobin: 10.2 g/dL — ABNORMAL LOW (ref 12.0–16.0)
MCH: 29.8 pg (ref 26.0–34.0)
MCH: 29.3 pg (ref 26.0–34.0)
MCHC: 34.2 g/dL (ref 32.0–36.0)
MCHC: 33.4 g/dL (ref 32.0–36.0)
MCV: 87.1 fL (ref 80.0–100.0)
MCV: 87.7 fL (ref 80.0–100.0)
PLATELETS: 165 10*3/uL (ref 150–440)
Platelets: 178 10*3/uL (ref 150–440)
RBC: 3.58 MIL/uL — ABNORMAL LOW (ref 3.80–5.20)
RBC: 3.48 MIL/uL — AB (ref 3.80–5.20)
RDW: 14.2 % (ref 11.5–14.5)
RDW: 14.4 % (ref 11.5–14.5)
WBC: 6.6 10*3/uL (ref 3.6–11.0)
WBC: 9.7 10*3/uL (ref 3.6–11.0)

## 2016-07-01 LAB — URINALYSIS, COMPLETE (UACMP) WITH MICROSCOPIC
Bacteria, UA: NONE SEEN
Bilirubin Urine: NEGATIVE
Glucose, UA: NEGATIVE mg/dL
KETONES UR: NEGATIVE mg/dL
LEUKOCYTES UA: NEGATIVE
Nitrite: NEGATIVE
PH: 6 (ref 5.0–8.0)
Protein, ur: 100 mg/dL — AB
Specific Gravity, Urine: 1.008 (ref 1.005–1.030)
Squamous Epithelial / LPF: NONE SEEN

## 2016-07-01 LAB — COMPREHENSIVE METABOLIC PANEL
ALBUMIN: 4.2 g/dL (ref 3.5–5.0)
ALT: 74 U/L — ABNORMAL HIGH (ref 14–54)
AST: 121 U/L — AB (ref 15–41)
Alkaline Phosphatase: 73 U/L (ref 38–126)
Anion gap: 7 (ref 5–15)
BUN: 67 mg/dL — AB (ref 6–20)
CHLORIDE: 103 mmol/L (ref 101–111)
CO2: 28 mmol/L (ref 22–32)
Calcium: 9.2 mg/dL (ref 8.9–10.3)
Creatinine, Ser: 3.14 mg/dL — ABNORMAL HIGH (ref 0.44–1.00)
GFR calc Af Amer: 15 mL/min — ABNORMAL LOW (ref >60)
GFR calc non Af Amer: 13 mL/min — ABNORMAL LOW (ref >60)
GLUCOSE: 107 mg/dL — AB (ref 65–99)
POTASSIUM: 4.8 mmol/L (ref 3.5–5.1)
SODIUM: 138 mmol/L (ref 135–145)
Total Bilirubin: 0.5 mg/dL (ref 0.3–1.2)
Total Protein: 7.1 g/dL (ref 6.5–8.1)

## 2016-07-01 LAB — CREATININE, SERUM
CREATININE: 2.77 mg/dL — AB (ref 0.44–1.00)
GFR calc non Af Amer: 15 mL/min — ABNORMAL LOW (ref >60)
GFR, EST AFRICAN AMERICAN: 18 mL/min — AB (ref >60)

## 2016-07-01 LAB — LIPASE, BLOOD: Lipase: 240 U/L — ABNORMAL HIGH (ref 11–51)

## 2016-07-01 LAB — TROPONIN I: Troponin I: <0.03 ng/mL (ref <0.03)

## 2016-07-01 LAB — TRIGLYCERIDES: TRIGLYCERIDES: 162 mg/dL — AB (ref <150)

## 2016-07-01 MED ORDER — SODIUM CHLORIDE 0.9 % IV SOLN
INTRAVENOUS | Status: DC
  Administered 2016-07-01 (×2): via INTRAVENOUS

## 2016-07-01 MED ORDER — HEPARIN SODIUM (PORCINE) 5000 UNIT/ML IJ SOLN
5000.0000 [IU] | Freq: Three times a day (TID) | INTRAMUSCULAR | Status: DC
  Administered 2016-07-01 – 2016-07-02 (×3): 5000 [IU] via SUBCUTANEOUS
  Filled 2016-07-01 (×3): qty 1

## 2016-07-01 MED ORDER — ACETAMINOPHEN 650 MG RE SUPP: 650.0000 mg | Freq: Four times a day (QID) | RECTAL | Status: DC | PRN

## 2016-07-01 MED ORDER — ASPIRIN EC 81 MG PO TBEC
81.0000 mg | DELAYED_RELEASE_TABLET | Freq: Every day | ORAL | Status: DC
  Administered 2016-07-01 – 2016-07-02 (×2): 81 mg via ORAL
  Filled 2016-07-01 (×2): qty 1

## 2016-07-01 MED ORDER — PAROXETINE HCL 20 MG PO TABS
10.0000 mg | ORAL_TABLET | Freq: Every day | ORAL | Status: DC
  Administered 2016-07-01 – 2016-07-02 (×2): 10 mg via ORAL
  Filled 2016-07-01 (×2): qty 1

## 2016-07-01 MED ORDER — FENTANYL CITRATE (PF) 100 MCG/2ML IJ SOLN
50.0000 ug | Freq: Once | INTRAMUSCULAR | Status: AC
  Administered 2016-07-01: 50 ug via INTRAVENOUS
  Filled 2016-07-01: qty 2

## 2016-07-01 MED ORDER — PANTOPRAZOLE SODIUM 40 MG PO TBEC
40.0000 mg | DELAYED_RELEASE_TABLET | Freq: Every day | ORAL | Status: DC
  Administered 2016-07-01 – 2016-07-02 (×2): 40 mg via ORAL
  Filled 2016-07-01 (×2): qty 1

## 2016-07-01 MED ORDER — LEVOTHYROXINE SODIUM 88 MCG PO TABS
88.0000 ug | ORAL_TABLET | Freq: Every day | ORAL | Status: DC
  Administered 2016-07-02: 88 ug via ORAL
  Filled 2016-07-01: qty 1

## 2016-07-01 MED ORDER — TRAMADOL HCL 50 MG PO TABS
50.0000 mg | ORAL_TABLET | Freq: Two times a day (BID) | ORAL | Status: DC | PRN
  Administered 2016-07-01: 50 mg via ORAL
  Filled 2016-07-01: qty 1

## 2016-07-01 MED ORDER — SODIUM CHLORIDE 0.9 % IV BOLUS (SEPSIS)
1000.0000 mL | Freq: Once | INTRAVENOUS | Status: AC
  Administered 2016-07-01: 1000 mL via INTRAVENOUS

## 2016-07-01 MED ORDER — ACETAMINOPHEN 325 MG PO TABS
650.0000 mg | ORAL_TABLET | Freq: Four times a day (QID) | ORAL | Status: DC | PRN
  Administered 2016-07-02: 650 mg via ORAL
  Filled 2016-07-01: qty 2

## 2016-07-01 MED ORDER — CLINDAMYCIN HCL 300 MG PO CAPS
300.0000 mg | ORAL_CAPSULE | Freq: Three times a day (TID) | ORAL | Status: DC
  Administered 2016-07-01 – 2016-07-02 (×3): 300 mg via ORAL
  Filled 2016-07-01 (×5): qty 1

## 2016-07-01 MED ORDER — AMLODIPINE BESYLATE 10 MG PO TABS
10.0000 mg | ORAL_TABLET | Freq: Every day | ORAL | Status: DC
  Administered 2016-07-01 – 2016-07-02 (×2): 10 mg via ORAL
  Filled 2016-07-01 (×2): qty 1

## 2016-07-01 MED ORDER — ONDANSETRON HCL 4 MG/2ML IJ SOLN
4.0000 mg | Freq: Once | INTRAMUSCULAR | Status: AC
  Administered 2016-07-01: 4 mg via INTRAVENOUS
  Filled 2016-07-01: qty 2

## 2016-07-01 MED ORDER — ONDANSETRON HCL 4 MG PO TABS: 4.0000 mg | ORAL_TABLET | Freq: Four times a day (QID) | ORAL | Status: DC | PRN

## 2016-07-01 MED ORDER — ANASTROZOLE 1 MG PO TABS: 1.0000 mg | ORAL_TABLET | Freq: Every day | ORAL | Status: DC

## 2016-07-01 MED ORDER — FENTANYL CITRATE (PF) 100 MCG/2ML IJ SOLN: 25.0000 ug | INTRAMUSCULAR | Status: DC | PRN

## 2016-07-01 MED ORDER — ONDANSETRON HCL 4 MG/2ML IJ SOLN: 4.0000 mg | Freq: Four times a day (QID) | INTRAMUSCULAR | Status: DC | PRN

## 2016-07-01 NOTE — ED Provider Notes (Signed)
[facility] Emergency Department Provider Note   ____________________________________________   First MD Initiated Contact with Patient 07/01/16 [phone]     (approximate)  I have reviewed the triage vital signs and the nursing notes.   HISTORY  Chief Complaint Abdominal Pain    HPI Erin Mccormick is a 81 y.o. female who presents to the ED from home with a chief complaint of abdominal pain. Patient awoke at 3 AM with epigastric pain radiating into her back. History of pancreatitis and states this feels similarly. Symptoms associated with nausea. Denies recent fever, chills, chest pain, shortness of breath, vomiting, diarrhea. Denies recent travel or trauma. Nothing makes her symptoms better or worse.   Past Medical History:  Diagnosis Date  . Anemia   . Breast cancer ([place]) left    NO BLOOD PRESSURES OR STICK IN LEFT ARM  . Chronic gastritis   . CKD (chronic kidney disease)   . Colon polyp   . Depression   . Gastric ulcer   . Gout   . Hyperlipidemia   . Hyperlipidemia   . Hypertension   . Osteoporosis   . Personal history of radiation therapy 2011   BREAST CA  . Renal cell carcinoma ([place])    left    There are no active problems to display for this patient.   Past Surgical History:  Procedure Laterality Date  . BREAST EXCISIONAL BIOPSY Right 2011   NEG  . BREAST LUMPECTOMY Left 2011   BREAST CA  . CHOLECYSTECTOMY    . KIDNEY SURGERY Right   . LUMBAR LAMINECTOMY    . NEPHRECTOMY Left   . THYROID SURGERY    . TONSILLECTOMY      Prior to Admission medications   Medication Sig Start Date End Date Taking? Authorizing Provider  amLODipine (NORVASC) 10 MG tablet Take 10 mg by mouth daily.   Yes [provider]  amoxicillin (AMOXIL) 500 MG capsule Take 500 mg by mouth 3 (three) times daily.   Yes [provider]  aspirin 81 MG tablet Take 81 mg by mouth daily.   Yes [provider]  calcium acetate (PHOSLO) 667 MG  capsule Take by mouth 3 (three) times daily with meals.   Yes [provider]  Cholecalciferol (VITAMIN D) 2000 UNITS CAPS Take 2,000 capsules by mouth daily.   Yes [provider]  diphenoxylate-atropine (LOMOTIL) 2.5-0.025 MG tablet Take 1 tablet by mouth as needed for diarrhea or loose stools.   Yes [provider]  furosemide (LASIX) 40 MG tablet Take 40 mg by mouth daily.   Yes [provider]  levothyroxine (SYNTHROID, LEVOTHROID) 88 MCG tablet Take 88 mcg by mouth daily.   Yes [provider]  metoprolol (TOPROL-XL) 200 MG 24 hr tablet Take 200 mg by mouth daily.   Yes [provider]  pantoprazole (PROTONIX) 20 MG tablet Take 1 tablet (20 mg total) by mouth daily. 05/03/15  Yes Pyrtle, Lajuan Lines, MD  PARoxetine (PAXIL) 10 MG tablet Take 10 mg by mouth daily.   Yes [provider]  pravastatin (PRAVACHOL) 20 MG tablet Take 20 mg by mouth daily.   Yes [provider]  anastrozole (ARIMIDEX) 1 MG tablet Take 1 mg by mouth daily.    [provider]  ondansetron (ZOFRAN ODT) 4 MG disintegrating tablet Take 1 tablet (4 mg total) by mouth every 6 (six) hours as needed for nausea or vomiting. Patient not taking: Reported on 07/01/2016 08/05/15  Delman Kitten, MD  traMADol (ULTRAM) 50 MG tablet Take 1 tablet (50 mg total) by mouth every 6 (six) hours as needed. Patient not taking: Reported on 07/01/2016 08/05/15   Delman Kitten, MD    Allergies Morphine; Ace inhibitors; Morphine and related; Sulfamethoxazole-trimethoprim; Allopurinol; and Other  Family History  Problem Relation Age of Onset  . Lung cancer Father   . Colon cancer Neg Hx   . Kidney disease Neg Hx   . Breast cancer Neg Hx     Social History Social History  Substance Use Topics  . Smoking status: Never Smoker  . Smokeless tobacco: Never Used  . Alcohol use No    Review of Systems  Constitutional: No fever/chills. Eyes: No visual changes. ENT: No  sore throat. Cardiovascular: Denies chest pain. Respiratory: Denies shortness of breath. Gastrointestinal: Positive for abdominal pain and nausea, no vomiting.  No diarrhea.  No constipation. Genitourinary: Negative for dysuria. Musculoskeletal: Negative for back pain. Skin: Negative for rash. Neurological: Negative for headaches, focal weakness or numbness.   ____________________________________________   PHYSICAL EXAM:  VITAL SIGNS: ED Triage Vitals [07/01/16 0413]  Enc Vitals Group     BP (!) 142/49     Pulse Rate (!) 52     Resp 18     Temp 98 F (36.7 C)     Temp Source Oral     SpO2 100 %     Weight 112 lb (50.8 kg)     Height 5\' 5"  (1.651 m)     Head Circumference      Peak Flow      Pain Score 6     Pain Loc      Pain Edu?      Excl. in [place]?     Constitutional: Alert and oriented. Well appearing and in mild acute distress. Eyes: Conjunctivae are normal. PERRL. EOMI. Head: Atraumatic. Nose: No congestion/rhinnorhea. Mouth/Throat: Mucous membranes are moist.  Oropharynx non-erythematous. Neck: No stridor.  Cardiovascular: Normal rate, regular rhythm. Grossly normal heart sounds.  Good peripheral circulation. Respiratory: Normal respiratory effort.  No retractions. Lungs CTAB. Gastrointestinal: Soft and moderately tender to palpation epigastrium without rebound or guarding. No distention. No abdominal bruits. No CVA tenderness. Musculoskeletal: No lower extremity tenderness nor edema.  No joint effusions. Neurologic:  Normal speech and language. No gross focal neurologic deficits are appreciated. No gait instability. Skin:  Skin is warm, dry and intact. No rash noted. Psychiatric: Mood and affect are normal. Speech and behavior are normal.  ____________________________________________   LABS (all labs ordered are listed, but only abnormal results are displayed)  Labs Reviewed  LIPASE, BLOOD - Abnormal; Notable for the following:       Result Value   Lipase  240 (*)    All other components within normal limits  COMPREHENSIVE METABOLIC PANEL - Abnormal; Notable for the following:    Glucose, Bld 107 (*)    BUN 67 (*)    Creatinine, Ser 3.14 (*)    AST 121 (*)    ALT 74 (*)    GFR calc non Af Amer 13 (*)    GFR calc Af Amer 15 (*)    All other components within normal limits  CBC - Abnormal; Notable for the following:    RBC 3.58 (*)    Hemoglobin 10.7 (*)    HCT 31.2 (*)    All other components within normal limits  URINALYSIS, COMPLETE (UACMP) WITH MICROSCOPIC - Abnormal; Notable for the following:  Color, Urine STRAW (*)    APPearance CLEAR (*)    Hgb urine dipstick SMALL (*)    Protein, ur 100 (*)    All other components within normal limits  TROPONIN I   ____________________________________________  EKG  ED ECG REPORT I, SUNG,JADE J, the attending physician, personally viewed and interpreted this ECG.   Date: 07/01/2016  EKG Time: 0417  Rate: 51  Rhythm: sinus bradycardia  Axis: Normal  Intervals:none  ST&T Change: Nonspecific  ____________________________________________  RADIOLOGY  No results found.  ____________________________________________   PROCEDURES  Procedure(s) performed: None  Procedures  Critical Care performed: No  ____________________________________________   INITIAL IMPRESSION / ASSESSMENT AND PLAN / ED COURSE  Pertinent labs & imaging results that were available during my care of the patient were reviewed by me and considered in my medical decision making (see chart for details).  81 year old female who presents with epigastric discomfort. Laboratory results remarkable for elevated lipase, transaminases, and worsening kidney function. Will initiate IV fluid resuscitation, analgesia and discuss with hospitalist to evaluate patient in the emergency department for admission.      ____________________________________________   FINAL CLINICAL IMPRESSION(S) / ED DIAGNOSES  Final  diagnoses:  Epigastric pain  Acute pancreatitis, unspecified complication status, unspecified pancreatitis type  Chronic kidney disease, unspecified CKD stage      NEW MEDICATIONS STARTED DURING THIS VISIT:  New Prescriptions   No medications on file     Note:  This document was prepared using Dragon voice recognition software and may include unintentional dictation errors.    Paulette Blanch, MD 07/01/16 [phone]

## 2016-07-01 NOTE — Progress Notes (Signed)
Patient ID: Erin Mccormick, female   DOB: 09-Jun-1935, 81 y.o.   MRN: [id number]  [facility] PROGRESS NOTE  ROZLYN YERBY AUQ:[id number] DOB: [birth year] DOA: 07/01/2016 PCP: Dion Body, MD  HPI/Subjective: Patient feeling no pain whatsoever now. Came in with severe abdominal pain and found to have pancreatitis. CT scan of the abdomen and pelvis negative for pancreatic inflammation. Patient already had her gallbladder removed and does not drink alcohol.  Objective: Vitals:   07/01/16 1200 07/01/16 1622  BP: (!) 171/52 (!) 123/42  Pulse: (!) 55   Resp: 20   Temp: 97.3 F (36.3 C)     Filed Weights   07/01/16 0413  Weight: 50.8 kg (112 lb)    ROS: Review of Systems  Constitutional: Negative for chills and fever.  Eyes: Negative for blurred vision.  Respiratory: Negative for cough and shortness of breath.   Cardiovascular: Negative for chest pain.  Gastrointestinal: Negative for abdominal pain, constipation, diarrhea, nausea and vomiting.  Genitourinary: Negative for dysuria.  Musculoskeletal: Negative for joint pain.  Neurological: Negative for dizziness and headaches.   Exam: Physical Exam  HENT:  Nose: No mucosal edema.  Mouth/Throat: No oropharyngeal exudate or posterior oropharyngeal edema.  Eyes: Conjunctivae, EOM and lids are normal. Pupils are equal, round, and reactive to light.  Neck: No JVD present. Carotid bruit is not present. No edema present. No thyroid mass and no thyromegaly present.  Cardiovascular: S1 normal and S2 normal.  Exam reveals no gallop.   No murmur heard. Pulses:      Dorsalis pedis pulses are 2+ on the right side, and 2+ on the left side.  Respiratory: No respiratory distress. She has no wheezes. She has no rhonchi. She has no rales.  GI: Soft. Bowel sounds are normal. There is no tenderness.  Musculoskeletal:       Right shoulder: She exhibits no swelling.  Lymphadenopathy:    She has no cervical adenopathy.  Neurological: She  is alert. No cranial nerve deficit.  Skin: Skin is warm. No rash noted. Nails show no clubbing.  Psychiatric: She has a normal mood and affect.      Data Reviewed: Basic Metabolic Panel:  Recent Labs Lab 07/01/16 0424 07/01/16 0624  NA 138  --   K 4.8  --   CL 103  --   CO2 28  --   GLUCOSE 107*  --   BUN 67*  --   CREATININE 3.14* 2.77*  CALCIUM 9.2  --    Liver Function Tests:  Recent Labs Lab 07/01/16 0424  AST 121*  ALT 74*  ALKPHOS 73  BILITOT 0.5  PROT 7.1  ALBUMIN 4.2    Recent Labs Lab 07/01/16 0424  LIPASE 240*   CBC:  Recent Labs Lab 07/01/16 0424 07/01/16 0624  WBC 6.6 9.7  HGB 10.7* 10.2*  HCT 31.2* 30.5*  MCV 87.1 87.7  PLT 178 165   Cardiac Enzymes:  Recent Labs Lab 07/01/16 0424  TROPONINI <0.03    Scheduled Meds: . amLODipine  10 mg Oral Daily  . aspirin EC  81 mg Oral Daily  . clindamycin  300 mg Oral Q8H  . heparin  5,000 Units Subcutaneous Q8H  . [START ON 07/02/2016] levothyroxine  88 mcg Oral QAC breakfast  . pantoprazole  40 mg Oral Daily  . PARoxetine  10 mg Oral Daily   Continuous Infusions: . sodium chloride 50 mL/hr at 07/01/16 1515    Assessment/Plan:  1. Acute pancreatitis. Could be antibiotic  related. Patient was placed on Augmentin. Could be secondary to pravastatin so I'll hold this medication. Start full liquid diet since the patient has no pain. Continue IV fluids. Check a lipase tomorrow morning. Added on a triglyceride level which was normal. The patient does not drink alcohol and already has had her gallbladder removed. 2. Acute kidney injury on chronic kidney disease stage IV. Gentle IV fluid hydration 3. Essential hypertension restart Norvasc. 4. Bradycardia hold metoprolol at this point 5. GERD on Protonix 6. Depression on Paxil 7. Recent tooth work. Discontinue Augmentin and put on clindamycin instead  Code Status:     Code Status Orders        Start     Ordered   07/01/16 0854  Full  code  Continuous     07/01/16 0853    Code Status History    Date Active Date Inactive Code Status Order ID Comments User Context   This patient has a current code status but no historical code status.    Advance Directive Documentation     Most Recent Value  Type of Advance Directive  Living will  Pre-existing out of facility DNR order (yellow form or pink MOST form)  -  "MOST" Form in Place?  -     Family Communication: Family at bedside Disposition Plan: Potentially home tomorrow  Antibiotics:  Clindamycin  Time spent: 62minutes  Loletha Grayer  [employer]

## 2016-07-01 NOTE — ED Triage Notes (Signed)
Patient ambulatory to triage with steady gait, without difficulty or distress noted; pt reports awoke at 3am with upper abd pain radiating into back accomp by "gas"; denies hx of same

## 2016-07-01 NOTE — H&P (Signed)
[place] at [place] NAME: Erin Mccormick    MR#:  [id number]  DATE OF BIRTH:  1935/10/19  DATE OF ADMISSION:  07/01/2016  PRIMARY CARE PHYSICIAN: Dion Body, MD   REQUESTING/REFERRING PHYSICIAN:   CHIEF COMPLAINT:   Chief Complaint  Patient presents with  . Abdominal Pain    HISTORY OF PRESENT ILLNESS: Erin Mccormick  is a 81 y.o. female with a known history of Breast cancer, chronic gastritis, chronic renal disease, gastric ulcer, gout, hyperlipidemia, hypertension, osteoporosis, renal cell cancer presented to the emergency room with abdominal discomfort since 3 AM this morning. Patient also had some nausea and loose stool. The pain is located around the umbilicus and epigastrium and sharp in nature 5 out of 10 on a scale of 1-10. Patient was evaluated in the emergency room her lipase level was elevated. Patient had cholecystectomy 5 years ago. No complaints of any shortness of breath. No fever or chills. Hospitalist service was consulted for further care of the patient.  PAST MEDICAL HISTORY:   Past Medical History:  Diagnosis Date  . Anemia   . Breast cancer ([place]) left    NO BLOOD PRESSURES OR STICK IN LEFT ARM  . Chronic gastritis   . CKD (chronic kidney disease)   . Colon polyp   . Depression   . Gastric ulcer   . Gout   . Hyperlipidemia   . Hyperlipidemia   . Hypertension   . Osteoporosis   . Personal history of radiation therapy 2011   BREAST CA  . Renal cell carcinoma ([place])    left    PAST SURGICAL HISTORY: Past Surgical History:  Procedure Laterality Date  . BREAST EXCISIONAL BIOPSY Right 2011   NEG  . BREAST LUMPECTOMY Left 2011   BREAST CA  . CHOLECYSTECTOMY    . KIDNEY SURGERY Right   . LUMBAR LAMINECTOMY    . NEPHRECTOMY Left   . THYROID SURGERY    . TONSILLECTOMY      SOCIAL HISTORY:  Social History  Substance Use Topics  . Smoking status: Never Smoker  . Smokeless tobacco: Never Used  .  Alcohol use No    FAMILY HISTORY:  Family History  Problem Relation Age of Onset  . Lung cancer Father   . Colon cancer Neg Hx   . Kidney disease Neg Hx   . Breast cancer Neg Hx     DRUG ALLERGIES:  Allergies  Allergen Reactions  . Morphine Nausea Only and Nausea And Vomiting  . Ace Inhibitors Other (See Comments)    hyperkalemia   . Morphine And Related Nausea And Vomiting  . Sulfamethoxazole-Trimethoprim Nausea And Vomiting  . Allopurinol Diarrhea  . Other Rash    Adhesive bandages- causes skin irritation and redness    REVIEW OF SYSTEMS:   CONSTITUTIONAL: No fever, fatigue or weakness.  EYES: No blurred or double vision.  EARS, NOSE, AND THROAT: No tinnitus or ear pain.  RESPIRATORY: No cough, shortness of breath, wheezing or hemoptysis.  CARDIOVASCULAR: No chest pain, orthopnea, edema.  GASTROINTESTINAL: Has nausea, no vomiting,  Has diarrhea and abdominal pain.  GENITOURINARY: No dysuria, hematuria.  ENDOCRINE: No polyuria, nocturia,  HEMATOLOGY: No anemia, easy bruising or bleeding SKIN: No rash or lesion. MUSCULOSKELETAL: No joint pain or arthritis.   NEUROLOGIC: No tingling, numbness, weakness.  PSYCHIATRY: No anxiety or depression.   MEDICATIONS AT HOME:  Prior to Admission medications   Medication Sig Start Date End Date Taking?  Authorizing Provider  amLODipine (NORVASC) 10 MG tablet Take 10 mg by mouth daily.   Yes [provider]  amoxicillin (AMOXIL) 500 MG capsule Take 500 mg by mouth 3 (three) times daily.   Yes [provider]  aspirin 81 MG tablet Take 81 mg by mouth daily.   Yes [provider]  calcium acetate (PHOSLO) 667 MG capsule Take by mouth 3 (three) times daily with meals.   Yes [provider]  Cholecalciferol (VITAMIN D) 2000 UNITS CAPS Take 2,000 capsules by mouth daily.   Yes [provider]  diphenoxylate-atropine (LOMOTIL) 2.5-0.025 MG tablet Take 1 tablet by mouth as needed for diarrhea or  loose stools.   Yes [provider]  furosemide (LASIX) 40 MG tablet Take 40 mg by mouth daily.   Yes [provider]  levothyroxine (SYNTHROID, LEVOTHROID) 88 MCG tablet Take 88 mcg by mouth daily.   Yes [provider]  metoprolol (TOPROL-XL) 200 MG 24 hr tablet Take 200 mg by mouth daily.   Yes [provider]  pantoprazole (PROTONIX) 20 MG tablet Take 1 tablet (20 mg total) by mouth daily. 05/03/15  Yes Pyrtle, Lajuan Lines, MD  PARoxetine (PAXIL) 10 MG tablet Take 10 mg by mouth daily.   Yes [provider]  pravastatin (PRAVACHOL) 20 MG tablet Take 20 mg by mouth daily.   Yes [provider]  anastrozole (ARIMIDEX) 1 MG tablet Take 1 mg by mouth daily.    [provider]  ondansetron (ZOFRAN ODT) 4 MG disintegrating tablet Take 1 tablet (4 mg total) by mouth every 6 (six) hours as needed for nausea or vomiting. Patient not taking: Reported on 07/01/2016 08/05/15   Delman Kitten, MD  traMADol (ULTRAM) 50 MG tablet Take 1 tablet (50 mg total) by mouth every 6 (six) hours as needed. Patient not taking: Reported on 07/01/2016 08/05/15   Delman Kitten, MD      PHYSICAL EXAMINATION:   VITAL SIGNS: Blood pressure (!) 152/51, pulse 60, temperature 98 F (36.7 C), temperature source Oral, resp. rate 18, height 5\' 5"  (1.651 m), weight 50.8 kg (112 lb), SpO2 100 %.  GENERAL:  81 y.o.-year-old patient lying in the bed with no acute distress.  EYES: Pupils equal, round, reactive to light and accommodation. No scleral icterus. Extraocular muscles intact.  HEENT: Head atraumatic, normocephalic. Oropharynx dry and nasopharynx clear.  NECK:  Supple, no jugular venous distention. No thyroid enlargement, no tenderness.  LUNGS: Normal breath sounds bilaterally, no wheezing, rales,rhonchi or crepitation. No use of accessory muscles of respiration.  CARDIOVASCULAR: S1, S2 normal. No murmurs, rubs, or gallops.  ABDOMEN: Soft, tendnerness around umbilicus and  epigastrium, nondistended. Bowel sounds present. No organomegaly or mass.  EXTREMITIES: No pedal edema, cyanosis, or clubbing.  NEUROLOGIC: Cranial nerves II through XII are intact. Muscle strength 5/5 in all extremities. Sensation intact. Gait not checked.  PSYCHIATRIC: The patient is alert and oriented x 3.  SKIN: No obvious rash, lesion, or ulcer.   LABORATORY PANEL:   CBC  Recent Labs Lab 07/01/16 0424  WBC 6.6  HGB 10.7*  HCT 31.2*  PLT 178  MCV 87.1  MCH 29.8  MCHC 34.2  RDW 14.2   ------------------------------------------------------------------------------------------------------------------  Chemistries   Recent Labs Lab 07/01/16 0424  NA 138  K 4.8  CL 103  CO2 28  GLUCOSE 107*  BUN 67*  CREATININE 3.14*  CALCIUM 9.2  AST 121*  ALT 74*  ALKPHOS 73  BILITOT 0.5   ------------------------------------------------------------------------------------------------------------------  estimated creatinine clearance is 11.7 mL/min (A) (by C-G formula based on SCr of 3.14 mg/dL (H)). ------------------------------------------------------------------------------------------------------------------ No results for input(s): TSH, T4TOTAL, T3FREE, THYROIDAB in the last 72 hours.  Invalid input(s): FREET3   Coagulation profile No results for input(s): INR, PROTIME in the last 168 hours. ------------------------------------------------------------------------------------------------------------------- No results for input(s): DDIMER in the last 72 hours. -------------------------------------------------------------------------------------------------------------------  Cardiac Enzymes  Recent Labs Lab 07/01/16 0424  TROPONINI <0.03   ------------------------------------------------------------------------------------------------------------------ Invalid input(s):  POCBNP  ---------------------------------------------------------------------------------------------------------------  Urinalysis    Component Value Date/Time   COLORURINE STRAW (A) 07/01/2016 0424   APPEARANCEUR CLEAR (A) 07/01/2016 0424   APPEARANCEUR Clear 07/12/2011 1650   LABSPEC 1.008 07/01/2016 0424   LABSPEC 1.003 07/12/2011 1650   PHURINE 6.0 07/01/2016 0424   GLUCOSEU NEGATIVE 07/01/2016 0424   GLUCOSEU Negative 07/12/2011 1650   HGBUR SMALL (A) 07/01/2016 0424   BILIRUBINUR NEGATIVE 07/01/2016 0424   BILIRUBINUR Negative 07/12/2011 1650   KETONESUR NEGATIVE 07/01/2016 0424   PROTEINUR 100 (A) 07/01/2016 0424   NITRITE NEGATIVE 07/01/2016 0424   LEUKOCYTESUR NEGATIVE 07/01/2016 0424   LEUKOCYTESUR Negative 07/12/2011 1650     RADIOLOGY: No results found.  EKG: Orders placed or performed during the hospital encounter of 07/01/16  . ED EKG  . ED EKG    IMPRESSION AND PLAN: 81 year old female patient with history of chronic kidney disease, hyperlipidemia, hypertension, gout, breast cancer presented to the emergency room with abdominal pain and diarrhea and nausea. Admitting diagnosis 1. Acute pancreatitis 2. Chronic kidney disease 3. Hyperlipidemia 4. Hypertension 5. Abdominal pain Treatment plan Admit patient to medical floor IV fluid hydration Keep patient nothing by mouth Pain management with IV fentanyl Follow-up lipase level Follow-up renal function All the records are reviewed and case discussed with ED provider. Management plans discussed with the patient, family and they are in agreement.  CODE STATUS:FULL CODE Surrogate decision maker : husband Code Status History    This patient does not have a recorded code status. Please follow your organizational policy for patients in this situation.    Advance Directive Documentation     Most Recent Value  Type of Advance Directive  Living will  Pre-existing out of facility DNR order (yellow form or  pink MOST form)  -  "MOST" Form in Place?  -       TOTAL TIME TAKING CARE OF THIS PATIENT: 53 minutes.    Saundra Shelling M.D on 07/01/2016 at 6:35 AM  Between 7am to 6pm - Pager - [phone]  After 6pm go to [email or web address] - password [email or web address] [facility]  Office  [phone]  CC: Primary care physician; Dion Body, MD

## 2016-07-02 DIAGNOSIS — K853 Drug induced acute pancreatitis without necrosis or infection: Secondary | ICD-10-CM | POA: Diagnosis not present

## 2016-07-02 DIAGNOSIS — R1013 Epigastric pain: Secondary | ICD-10-CM | POA: Diagnosis not present

## 2016-07-02 LAB — CBC
HEMATOCRIT: 28.8 % — AB (ref 35.0–47.0)
HEMOGLOBIN: 9.7 g/dL — AB (ref 12.0–16.0)
MCH: 29.3 pg (ref 26.0–34.0)
MCHC: 33.6 g/dL (ref 32.0–36.0)
MCV: 87.3 fL (ref 80.0–100.0)
Platelets: 157 10*3/uL (ref 150–440)
RBC: 3.3 MIL/uL — ABNORMAL LOW (ref 3.80–5.20)
RDW: 14.2 % (ref 11.5–14.5)
WBC: 3.6 10*3/uL (ref 3.6–11.0)

## 2016-07-02 LAB — BASIC METABOLIC PANEL
Anion gap: 4 — ABNORMAL LOW (ref 5–15)
BUN: 51 mg/dL — ABNORMAL HIGH (ref 6–20)
CALCIUM: 8.4 mg/dL — AB (ref 8.9–10.3)
CO2: 27 mmol/L (ref 22–32)
CREATININE: 2.74 mg/dL — AB (ref 0.44–1.00)
Chloride: 113 mmol/L — ABNORMAL HIGH (ref 101–111)
GFR calc non Af Amer: 15 mL/min — ABNORMAL LOW (ref >60)
GFR, EST AFRICAN AMERICAN: 18 mL/min — AB (ref >60)
GLUCOSE: 82 mg/dL (ref 65–99)
Potassium: 4.7 mmol/L (ref 3.5–5.1)
Sodium: 144 mmol/L (ref 135–145)

## 2016-07-02 LAB — HEPATIC FUNCTION PANEL
ALBUMIN: 3.3 g/dL — AB (ref 3.5–5.0)
ALK PHOS: 93 U/L (ref 38–126)
ALT: 245 U/L — ABNORMAL HIGH (ref 14–54)
AST: 157 U/L — ABNORMAL HIGH (ref 15–41)
BILIRUBIN TOTAL: 0.4 mg/dL (ref 0.3–1.2)
Bilirubin, Direct: <0.1 mg/dL — ABNORMAL LOW (ref 0.1–0.5)
Total Protein: 6.1 g/dL — ABNORMAL LOW (ref 6.5–8.1)

## 2016-07-02 LAB — LIPASE, BLOOD: LIPASE: 29 U/L (ref 11–51)

## 2016-07-02 MED ORDER — CLINDAMYCIN HCL 300 MG PO CAPS: 300.0000 mg | ORAL_CAPSULE | Freq: Three times a day (TID) | ORAL | 0 refills | Status: AC

## 2016-07-02 NOTE — Progress Notes (Signed)
Pt to be discharged per MD order. IV removed. Instructions reviewed with pt and family. All questions answered and education completed. Scripts electronically sent. Will discharge in wheelchair.

## 2016-07-02 NOTE — Discharge Summary (Signed)
[place] at [place] NAME: Erin Mccormick    MR#:  [id number]  DATE OF BIRTH:  [birth year]  DATE OF ADMISSION:  07/01/2016 ADMITTING PHYSICIAN: Saundra Shelling, MD  DATE OF DISCHARGE: 07/02/2016  PRIMARY CARE PHYSICIAN: Dion Body, MD    ADMISSION DIAGNOSIS:  Epigastric pain [R10.13] Acute pancreatitis, unspecified complication status, unspecified pancreatitis type [K85.90] Chronic kidney disease, unspecified CKD stage [N18.9]  DISCHARGE DIAGNOSIS:  Active Problems:   Acute pancreatitis   SECONDARY DIAGNOSIS:   Past Medical History:  Diagnosis Date  . Anemia   . Breast cancer ([place]) left    NO BLOOD PRESSURES OR STICK IN LEFT ARM  . Chronic gastritis   . CKD (chronic kidney disease)   . Colon polyp   . Depression   . Gastric ulcer   . Gout   . Hyperlipidemia   . Hyperlipidemia   . Hypertension   . Osteoporosis   . Personal history of radiation therapy 2011   BREAST CA  . Renal cell carcinoma ([place])    left    HOSPITAL COURSE:  81 year old female with essential hypertension and hypothyroidism who presented with severe abdominal pain and found to have elevated lipase.  1. Acute pancreatitis with abdominal pain: Patient's abdominal pain had subsided. She was treated for acute pancreatitis with nothing by mouth, IV fluids and supportive medications including antiemetics and pain medications. Patient's pancreatitis has resolved very quickly. Unclear etiology at this point of pancreatitis. Augmentin can be 1 culprit and therefore has been discontinued and in its place clindamycin has been used. Current with her level was within normal limits. She has had her gallbladder removed. She does not drink EtOH.  2. Essential hypertension: Patient will continue outpatient medications. Patient does have baseline bradycardia but is asymptomatic.  3. Hyperlipidemia: Patient will continue her statin medication. I do not think this is causing  pancreatitis.  4. Recent tooth extraction: Patient will follow. Simona Huh today as there is question whether the suturing is loose.  5. Acute kidney injury on chronic kidney disease stage IV: Patient's creatinine now is at baseline  6. Depression: Continue Paxil  DISCHARGE CONDITIONS AND DIET:   Stable for discharge on heart healthy diet  CONSULTS OBTAINED:    DRUG ALLERGIES:   Allergies  Allergen Reactions  . Morphine Nausea Only and Nausea And Vomiting  . Ace Inhibitors Other (See Comments)    hyperkalemia   . Morphine And Related Nausea And Vomiting  . Sulfamethoxazole-Trimethoprim Nausea And Vomiting  . Allopurinol Diarrhea  . Other Rash    Adhesive bandages- causes skin irritation and redness    DISCHARGE MEDICATIONS:   Current Discharge Medication List    START taking these medications   Details  clindamycin (CLEOCIN) 300 MG capsule Take 1 capsule (300 mg total) by mouth every 8 (eight) hours. Qty: 18 capsule, Refills: 0      CONTINUE these medications which have NOT CHANGED   Details  amLODipine (NORVASC) 10 MG tablet Take 10 mg by mouth daily.    aspirin 81 MG tablet Take 81 mg by mouth daily.    calcium acetate (PHOSLO) 667 MG capsule Take by mouth 3 (three) times daily with meals.    Cholecalciferol (VITAMIN D) 2000 UNITS CAPS Take 2,000 capsules by mouth daily.    diphenoxylate-atropine (LOMOTIL) 2.5-0.025 MG tablet Take 1 tablet by mouth as needed for diarrhea or loose stools.    furosemide (LASIX) 40 MG tablet Take 40 mg by mouth daily.  levothyroxine (SYNTHROID, LEVOTHROID) 88 MCG tablet Take 88 mcg by mouth daily.    metoprolol (TOPROL-XL) 200 MG 24 hr tablet Take 200 mg by mouth daily.    pantoprazole (PROTONIX) 20 MG tablet Take 1 tablet (20 mg total) by mouth daily. Qty: 90 tablet, Refills: 1    PARoxetine (PAXIL) 10 MG tablet Take 10 mg by mouth daily.    pravastatin (PRAVACHOL) 20 MG tablet Take 20 mg by mouth daily.    anastrozole  (ARIMIDEX) 1 MG tablet Take 1 mg by mouth daily.      STOP taking these medications     amoxicillin (AMOXIL) 500 MG capsule      ondansetron (ZOFRAN ODT) 4 MG disintegrating tablet      traMADol (ULTRAM) 50 MG tablet           Today   CHIEF COMPLAINT:  Patient without abdominal pain. Doing well.    VITAL SIGNS:  Blood pressure (!) 144/62, pulse (!) 58, temperature 98.4 F (36.9 C), temperature source Oral, resp. rate 19, height 5\' 5"  (1.651 m), weight 50.8 kg (112 lb), SpO2 97 %.   REVIEW OF SYSTEMS:  Review of Systems  Constitutional: Negative.  Negative for chills, fever and malaise/fatigue.  HENT: Negative.  Negative for ear discharge, ear pain, hearing loss, nosebleeds and sore throat.        Tooth pain  Eyes: Negative.  Negative for blurred vision and pain.  Respiratory: Negative.  Negative for cough, hemoptysis, shortness of breath and wheezing.   Cardiovascular: Negative.  Negative for chest pain, palpitations and leg swelling.  Gastrointestinal: Negative.  Negative for abdominal pain, blood in stool, diarrhea, nausea and vomiting.  Genitourinary: Negative.  Negative for dysuria.  Musculoskeletal: Negative.  Negative for back pain.  Skin: Negative.   Neurological: Negative for dizziness, tremors, speech change, focal weakness, seizures and headaches.  Endo/Heme/Allergies: Negative.  Does not bruise/bleed easily.  Psychiatric/Behavioral: Negative.  Negative for depression, hallucinations and suicidal ideas.     PHYSICAL EXAMINATION:  GENERAL:  81 y.o.-year-old patient lying in the bed with no acute distress.  NECK:  Supple, no jugular venous distention. No thyroid enlargement, no tenderness. Tooth between 2 gold tooth has loose sutirung  LUNGS: Normal breath sounds bilaterally, no wheezing, rales,rhonchi  No use of accessory muscles of respiration.  CARDIOVASCULAR: S1, S2 normal. No murmurs, rubs, or gallops.  ABDOMEN: Soft, non-tender, non-distended. Bowel  sounds present. No organomegaly or mass.  EXTREMITIES: No pedal edema, cyanosis, or clubbing.  PSYCHIATRIC: The patient is alert and oriented x 3.  SKIN: No obvious rash, lesion, or ulcer.   DATA REVIEW:   CBC  Recent Labs Lab 07/02/16 0439  WBC 3.6  HGB 9.7*  HCT 28.8*  PLT 157    Chemistries   Recent Labs Lab 07/02/16 0439  NA 144  K 4.7  CL 113*  CO2 27  GLUCOSE 82  BUN 51*  CREATININE 2.74*  CALCIUM 8.4*  AST 157*  ALT 245*  ALKPHOS 93  BILITOT 0.4    Cardiac Enzymes  Recent Labs Lab 07/01/16 0424  TROPONINI <0.03    Microbiology Results  @[email or web address] @  RADIOLOGY:  No results found.    Current Discharge Medication List    START taking these medications   Details  clindamycin (CLEOCIN) 300 MG capsule Take 1 capsule (300 mg total) by mouth every 8 (eight) hours. Qty: 18 capsule, Refills: 0      CONTINUE these medications which have NOT CHANGED   Details  amLODipine (NORVASC) 10 MG tablet Take 10 mg by mouth daily.    aspirin 81 MG tablet Take 81 mg by mouth daily.    calcium acetate (PHOSLO) 667 MG capsule Take by mouth 3 (three) times daily with meals.    Cholecalciferol (VITAMIN D) 2000 UNITS CAPS Take 2,000 capsules by mouth daily.    diphenoxylate-atropine (LOMOTIL) 2.5-0.025 MG tablet Take 1 tablet by mouth as needed for diarrhea or loose stools.    furosemide (LASIX) 40 MG tablet Take 40 mg by mouth daily.    levothyroxine (SYNTHROID, LEVOTHROID) 88 MCG tablet Take 88 mcg by mouth daily.    metoprolol (TOPROL-XL) 200 MG 24 hr tablet Take 200 mg by mouth daily.    pantoprazole (PROTONIX) 20 MG tablet Take 1 tablet (20 mg total) by mouth daily. Qty: 90 tablet, Refills: 1    PARoxetine (PAXIL) 10 MG tablet Take 10 mg by mouth daily.    pravastatin (PRAVACHOL) 20 MG tablet Take 20 mg by mouth daily.    anastrozole (ARIMIDEX) 1 MG tablet Take 1 mg by mouth daily.      STOP taking these medications     amoxicillin (AMOXIL)  500 MG capsule      ondansetron (ZOFRAN ODT) 4 MG disintegrating tablet      traMADol (ULTRAM) 50 MG tablet          Management plans discussed with the patient and she is in agreement. Stable for discharge home  Patient should follow up with her [facility] and PCp  CODE STATUS:     Code Status Orders        Start     Ordered   07/01/16 0854  Full code  Continuous     07/01/16 0853    Code Status History    Date Active Date Inactive Code Status Order ID Comments User Context   This patient has a current code status but no historical code status.    Advance Directive Documentation     Most Recent Value  Type of Advance Directive  Living will  Pre-existing out of facility DNR order (yellow form or pink MOST form)  -  "MOST" Form in Place?  -      TOTAL TIME TAKING CARE OF THIS PATIENT: 37 minutes.    Note: This dictation was prepared with Dragon dictation along with smaller phrase technology. Any transcriptional errors that result from this process are unintentional.  Jafar Poffenberger M.D on 07/02/2016 at 9:32 AM  Between 7am to 6pm - Pager - [phone] After 6pm go to [email or web address] - password [email or web address] [facility]  Office  [phone]  CC: Primary care physician; Dion Body, MD

## 2016-09-18 ENCOUNTER — Encounter: Payer: Self-pay | Admitting: *Deleted

## 2016-10-01 ENCOUNTER — Ambulatory Visit (INDEPENDENT_AMBULATORY_CARE_PROVIDER_SITE_OTHER): Payer: Medicare Other | Admitting: Internal Medicine

## 2016-10-01 ENCOUNTER — Encounter: Payer: Self-pay | Admitting: Internal Medicine

## 2016-10-01 VITALS — BP 118/60 | HR 66 | Ht 64.0 in | Wt 113.2 lb

## 2016-10-01 DIAGNOSIS — K580 Irritable bowel syndrome with diarrhea: Secondary | ICD-10-CM | POA: Diagnosis not present

## 2016-10-01 DIAGNOSIS — Z8719 Personal history of other diseases of the digestive system: Secondary | ICD-10-CM | POA: Diagnosis not present

## 2016-10-01 DIAGNOSIS — R195 Other fecal abnormalities: Secondary | ICD-10-CM | POA: Diagnosis not present

## 2016-10-01 MED ORDER — DIPHENOXYLATE-ATROPINE 2.5-0.025 MG PO TABS: 1.0000 | ORAL_TABLET | ORAL | 3 refills | Status: AC | PRN

## 2016-10-01 NOTE — Patient Instructions (Signed)
We have sent the following medications to your pharmacy for you to pick up at your convenience: Lomotil  Please follow up with Dr Hilarie Fredrickson in 1 year.  Your physician has requested that you go to the basement for the following lab work before leaving today: Hepatic Function  If you are age 79 or older, your body mass index should be between 23-30. Your Body mass index is 19.44 kg/m. If this is out of the aforementioned range listed, please consider follow up with your Primary Care Provider.  If you are age 33 or younger, your body mass index should be between 19-25. Your Body mass index is 19.44 kg/m. If this is out of the aformentioned range listed, please consider follow up with your Primary Care Provider.

## 2016-10-01 NOTE — Progress Notes (Signed)
Subjective:    Patient ID: Erin Mccormick, female    DOB: 1935/04/21, 81 y.o.   MRN: [id number]  HPI Erin Mccormick is a 81 year old female with a history of gastritis, intermittent loose stools, renal cell cancer status post nephrectomy in 2002, partial right nephrectomy in 2004, chronic kidney disease, normocytic anemia, history of breast cancer, hypertension and hyperlipidemia who is here for follow-up. She was last seen on 05/03/2015.  She reports that today she is feeling well. She was hospitalized for acute pancreatitis in June 2018. This was a 2 day admission and her pancreatitis was felt to be secondary to Augmentin prescribed after a dental procedure. She has had prior cholecystectomy and does not drink alcohol. At the time of this admission her liver enzymes were elevated and I do not see that they have been rechecked. She denies any abdominal pain today. Her appetite has been good. She has been off of pantoprazole and I think this was removed by Dr. Amalia Hailey her nephrologist. Since stopping this medication she denies GERD, dyspepsia, indigestion, nausea and vomiting.  She does continue to have very infrequent days of loose stools. On these days she will use a single dose of Lomotil with total resolution of her symptoms. She estimates this is less than one day per week and she has only refilled her Lomotil prescription once since 2016. No blood in her stool or melena. Normal for her is about 3 bowel movements a day which are soft but formed. Since starting PhosLo her bowel movements tend to occur after eating. Her stools have remained dark while on oral iron.  Her last colonoscopy was done in 2015 which showed nonspecific colitis but no polyps.  Review of Systems As per history of present illness, otherwise negative  Current Medications, Allergies, Past Medical History, Past Surgical History, Family History and Social History were reviewed in [employer] record.       Objective:   Physical Exam BP 118/60   Pulse 66   Ht 5\' 4"  (1.626 m)   Wt 113 lb 4 oz (51.4 kg)   BMI 19.44 kg/m  Constitutional: Well-developed and well-nourished. No distress. HEENT: Normocephalic and atraumatic. Oropharynx is clear and moist. Conjunctivae are normal.  No scleral icterus. Neck: Neck supple. Trachea midline. Cardiovascular: Normal rate, regular rhythm and intact distal pulses. No M/R/G Pulmonary/chest: Effort normal and breath sounds normal. No wheezing, rales or rhonchi. Abdominal: Soft, nontender, nondistended. Bowel sounds active throughout. There are no masses palpable. No hepatosplenomegaly. Extremities: no clubbing, cyanosis, or edema Neurological: Alert and oriented to person place and time. Skin: Skin is warm and dry. Psychiatric: Normal mood and affect. Behavior is normal.      Assessment & Plan:  81 year old female with a history of gastritis, intermittent loose stools, renal cell cancer status post nephrectomy in 2002, partial right nephrectomy in 2004, chronic kidney disease, normocytic anemia, history of breast cancer, hypertension and hyperlipidemia who is here for follow-up.  1. Intermittent loose stools -- most likely irritable bowel in nature. Can continue Lomotil on an as-needed basis. She is using this very rarely and there are no alarm symptoms to warrant repeat endoscopic procedure.  2. History of gastritis -- off PPI likely due to her renal dysfunction. No symptoms of gastritis to warrant acid suppression.  3. CRC screening --  No evidence of colon polyps in 2015. She will likely not require future colonoscopies for screening or surveillance due to age. We discussed this today and she is  in agreement  4. History of pancreatitis with elevated liver enzymes -- pancreatitis likely secondary to Augmentin. I will repeat liver enzymes today to ensure they have normalized. No evidence of ongoing pancreatitis at present.  25 minutes spent with the  patient today. Greater than 50% was spent in counseling and coordination of care with the patient

## 2017-02-18 ENCOUNTER — Emergency Department: Payer: Medicare Other

## 2017-02-18 ENCOUNTER — Inpatient Hospital Stay
Admission: EM | Admit: 2017-02-18 | Discharge: 2017-03-05 | DRG: 673 | Disposition: E | Payer: Medicare Other | Attending: Internal Medicine | Admitting: Internal Medicine

## 2017-02-18 ENCOUNTER — Other Ambulatory Visit: Payer: Self-pay

## 2017-02-18 DIAGNOSIS — Z992 Dependence on renal dialysis: Secondary | ICD-10-CM

## 2017-02-18 DIAGNOSIS — Z7982 Long term (current) use of aspirin: Secondary | ICD-10-CM

## 2017-02-18 DIAGNOSIS — Z79890 Hormone replacement therapy (postmenopausal): Secondary | ICD-10-CM

## 2017-02-18 DIAGNOSIS — R0602 Shortness of breath: Secondary | ICD-10-CM | POA: Diagnosis not present

## 2017-02-18 DIAGNOSIS — F418 Other specified anxiety disorders: Secondary | ICD-10-CM | POA: Diagnosis present

## 2017-02-18 DIAGNOSIS — J189 Pneumonia, unspecified organism: Secondary | ICD-10-CM

## 2017-02-18 DIAGNOSIS — I5033 Acute on chronic diastolic (congestive) heart failure: Secondary | ICD-10-CM | POA: Diagnosis present

## 2017-02-18 DIAGNOSIS — N186 End stage renal disease: Secondary | ICD-10-CM | POA: Diagnosis present

## 2017-02-18 DIAGNOSIS — J154 Pneumonia due to other streptococci: Secondary | ICD-10-CM | POA: Diagnosis present

## 2017-02-18 DIAGNOSIS — Z86010 Personal history of colonic polyps: Secondary | ICD-10-CM

## 2017-02-18 DIAGNOSIS — J8410 Pulmonary fibrosis, unspecified: Secondary | ICD-10-CM | POA: Diagnosis present

## 2017-02-18 DIAGNOSIS — Z923 Personal history of irradiation: Secondary | ICD-10-CM

## 2017-02-18 DIAGNOSIS — N2889 Other specified disorders of kidney and ureter: Secondary | ICD-10-CM | POA: Diagnosis present

## 2017-02-18 DIAGNOSIS — Z515 Encounter for palliative care: Secondary | ICD-10-CM | POA: Diagnosis not present

## 2017-02-18 DIAGNOSIS — J9600 Acute respiratory failure, unspecified whether with hypoxia or hypercapnia: Secondary | ICD-10-CM

## 2017-02-18 DIAGNOSIS — N179 Acute kidney failure, unspecified: Principal | ICD-10-CM | POA: Diagnosis present

## 2017-02-18 DIAGNOSIS — J9601 Acute respiratory failure with hypoxia: Secondary | ICD-10-CM | POA: Diagnosis present

## 2017-02-18 DIAGNOSIS — I313 Pericardial effusion (noninflammatory): Secondary | ICD-10-CM | POA: Diagnosis present

## 2017-02-18 DIAGNOSIS — Z905 Acquired absence of kidney: Secondary | ICD-10-CM

## 2017-02-18 DIAGNOSIS — N2581 Secondary hyperparathyroidism of renal origin: Secondary | ICD-10-CM | POA: Diagnosis present

## 2017-02-18 DIAGNOSIS — Z91048 Other nonmedicinal substance allergy status: Secondary | ICD-10-CM

## 2017-02-18 DIAGNOSIS — M109 Gout, unspecified: Secondary | ICD-10-CM | POA: Diagnosis present

## 2017-02-18 DIAGNOSIS — Z5329 Procedure and treatment not carried out because of patient's decision for other reasons: Secondary | ICD-10-CM | POA: Diagnosis not present

## 2017-02-18 DIAGNOSIS — Z66 Do not resuscitate: Secondary | ICD-10-CM | POA: Diagnosis not present

## 2017-02-18 DIAGNOSIS — Z853 Personal history of malignant neoplasm of breast: Secondary | ICD-10-CM

## 2017-02-18 DIAGNOSIS — I132 Hypertensive heart and chronic kidney disease with heart failure and with stage 5 chronic kidney disease, or end stage renal disease: Secondary | ICD-10-CM | POA: Diagnosis present

## 2017-02-18 DIAGNOSIS — E860 Dehydration: Secondary | ICD-10-CM | POA: Diagnosis present

## 2017-02-18 DIAGNOSIS — Z9049 Acquired absence of other specified parts of digestive tract: Secondary | ICD-10-CM

## 2017-02-18 DIAGNOSIS — E039 Hypothyroidism, unspecified: Secondary | ICD-10-CM | POA: Diagnosis present

## 2017-02-18 DIAGNOSIS — Z792 Long term (current) use of antibiotics: Secondary | ICD-10-CM

## 2017-02-18 DIAGNOSIS — E785 Hyperlipidemia, unspecified: Secondary | ICD-10-CM | POA: Diagnosis present

## 2017-02-18 DIAGNOSIS — Z79811 Long term (current) use of aromatase inhibitors: Secondary | ICD-10-CM

## 2017-02-18 DIAGNOSIS — Z79899 Other long term (current) drug therapy: Secondary | ICD-10-CM

## 2017-02-18 DIAGNOSIS — E440 Moderate protein-calorie malnutrition: Secondary | ICD-10-CM | POA: Diagnosis not present

## 2017-02-18 DIAGNOSIS — D631 Anemia in chronic kidney disease: Secondary | ICD-10-CM | POA: Diagnosis present

## 2017-02-18 DIAGNOSIS — Z882 Allergy status to sulfonamides status: Secondary | ICD-10-CM

## 2017-02-18 DIAGNOSIS — Z888 Allergy status to other drugs, medicaments and biological substances status: Secondary | ICD-10-CM

## 2017-02-18 DIAGNOSIS — R627 Adult failure to thrive: Secondary | ICD-10-CM | POA: Diagnosis present

## 2017-02-18 DIAGNOSIS — Z885 Allergy status to narcotic agent status: Secondary | ICD-10-CM

## 2017-02-18 DIAGNOSIS — Z8711 Personal history of peptic ulcer disease: Secondary | ICD-10-CM

## 2017-02-18 DIAGNOSIS — I953 Hypotension of hemodialysis: Secondary | ICD-10-CM | POA: Diagnosis not present

## 2017-02-18 DIAGNOSIS — Z85528 Personal history of other malignant neoplasm of kidney: Secondary | ICD-10-CM

## 2017-02-18 DIAGNOSIS — J849 Interstitial pulmonary disease, unspecified: Secondary | ICD-10-CM | POA: Diagnosis present

## 2017-02-18 DIAGNOSIS — Z681 Body mass index (BMI) 19 or less, adult: Secondary | ICD-10-CM

## 2017-02-18 DIAGNOSIS — Z801 Family history of malignant neoplasm of trachea, bronchus and lung: Secondary | ICD-10-CM

## 2017-02-18 DIAGNOSIS — J9690 Respiratory failure, unspecified, unspecified whether with hypoxia or hypercapnia: Secondary | ICD-10-CM

## 2017-02-18 DIAGNOSIS — M810 Age-related osteoporosis without current pathological fracture: Secondary | ICD-10-CM | POA: Diagnosis present

## 2017-02-18 LAB — INFLUENZA PANEL BY PCR (TYPE A & B)
INFLAPCR: NEGATIVE
Influenza B By PCR: NEGATIVE

## 2017-02-18 LAB — BASIC METABOLIC PANEL
ANION GAP: 12 (ref 5–15)
BUN: 70 mg/dL — ABNORMAL HIGH (ref 6–20)
CO2: 24 mmol/L (ref 22–32)
Calcium: 9 mg/dL (ref 8.9–10.3)
Chloride: 97 mmol/L — ABNORMAL LOW (ref 101–111)
Creatinine, Ser: 3.51 mg/dL — ABNORMAL HIGH (ref 0.44–1.00)
GFR calc non Af Amer: 11 mL/min — ABNORMAL LOW (ref >60)
GFR, EST AFRICAN AMERICAN: 13 mL/min — AB (ref >60)
Glucose, Bld: 136 mg/dL — ABNORMAL HIGH (ref 65–99)
POTASSIUM: 4.3 mmol/L (ref 3.5–5.1)
SODIUM: 133 mmol/L — AB (ref 135–145)

## 2017-02-18 LAB — TROPONIN I

## 2017-02-18 LAB — CBC
HEMATOCRIT: 29.6 % — AB (ref 35.0–47.0)
HEMOGLOBIN: 9.9 g/dL — AB (ref 12.0–16.0)
MCH: 28.9 pg (ref 26.0–34.0)
MCHC: 33.3 g/dL (ref 32.0–36.0)
MCV: 86.7 fL (ref 80.0–100.0)
Platelets: 478 10*3/uL — ABNORMAL HIGH (ref 150–440)
RBC: 3.41 MIL/uL — AB (ref 3.80–5.20)
RDW: 13.5 % (ref 11.5–14.5)
WBC: 7.3 10*3/uL (ref 3.6–11.0)

## 2017-02-18 LAB — LACTIC ACID, PLASMA: LACTIC ACID, VENOUS: 1.1 mmol/L (ref 0.5–1.9)

## 2017-02-18 MED ORDER — SODIUM CHLORIDE 0.9 % IV SOLN
500.0000 mg | Freq: Once | INTRAVENOUS | Status: AC
  Administered 2017-02-19: 500 mg via INTRAVENOUS
  Filled 2017-02-18: qty 500

## 2017-02-18 MED ORDER — SODIUM CHLORIDE 0.9 % IV SOLN
1.0000 g | Freq: Once | INTRAVENOUS | Status: AC
  Administered 2017-02-18: 1 g via INTRAVENOUS
  Filled 2017-02-18: qty 10

## 2017-02-18 NOTE — ED Provider Notes (Signed)
[facility] Emergency Department Provider Note  Time seen: 9:46 PM  I have reviewed the triage vital signs and the nursing notes.   HISTORY  Chief Complaint Shortness of Breath    HPI Erin Mccormick is a 82 y.o. female with a past medical history of anemia, CKD status post nephrectomy, hypertension, hyperlipidemia, presents to the emergency department for shortness of breath.  According to the patient she had been feeling well until Sunday when she was feeling somewhat weak.  Patient had recent changes to her blood pressure medications she thought this could be related to that.  She states however she continued to feel more weak and yesterday was feeling short of breath in addition to the weakness went to her primary care doctor was noted to be hypoxic with an x-ray per patient consistent with double pneumonia.  Patient states her oxygen level improved while at the doctor's office and she was discharged on antibiotics.  Today she states even worsening shortness of breath with fatigue and weakness.  Came to the emergency department, upon arrival has a room air saturation of 68% with no home O2 requirement at baseline.  Patient feels moderately short of breath.  Denies any chest pain at any point.  Denies any leg pain or swelling.  Patient denies any fever but states she has been feeling chills and had some sweats yesterday.  Largely negative review of systems otherwise.   Past Medical History:  Diagnosis Date  . Acute pancreatitis   . Anemia   . Breast cancer ([place]) left    NO BLOOD PRESSURES OR STICK IN LEFT ARM  . Chronic gastritis   . CKD (chronic kidney disease)   . Colon polyp   . Depression   . Gastric ulcer   . Gout   . Hyperlipidemia   . Hypertension   . Osteoporosis   . Personal history of radiation therapy 2011   BREAST CA  . Renal cell carcinoma ([place])    left    Patient Active Problem List   Diagnosis Date Noted  . Acute pancreatitis 07/01/2016     Past Surgical History:  Procedure Laterality Date  . BREAST EXCISIONAL BIOPSY Right 2011   NEG  . BREAST LUMPECTOMY Left 2011   BREAST CA  . CHOLECYSTECTOMY    . KIDNEY SURGERY Right   . LUMBAR LAMINECTOMY    . NEPHRECTOMY Left   . THYROID SURGERY    . TONSILLECTOMY      Prior to Admission medications   Medication Sig Start Date End Date Taking? Authorizing Provider  amLODipine (NORVASC) 10 MG tablet Take 10 mg by mouth daily.    [provider]  anastrozole (ARIMIDEX) 1 MG tablet Take 1 mg by mouth daily.    [provider]  aspirin 81 MG tablet Take 81 mg by mouth daily.    [provider]  calcium acetate (PHOSLO) 667 MG capsule Take by mouth 3 (three) times daily with meals.    [provider]  Cholecalciferol (VITAMIN D) 2000 UNITS CAPS Take 2,000 capsules by mouth daily.    [provider]  diphenoxylate-atropine (LOMOTIL) 2.5-0.025 MG tablet Take 1 tablet by mouth as needed for diarrhea or loose stools. 10/01/16   Pyrtle, Lajuan Lines, MD  ferrous sulfate 325 (65 FE) MG tablet Take 325 mg by mouth daily with breakfast.    [provider]  furosemide (LASIX) 40 MG tablet Take 40 mg by mouth daily.    [provider]  levothyroxine (SYNTHROID, LEVOTHROID) 88 MCG tablet Take 88 mcg by mouth daily.    [provider]  metoprolol (TOPROL-XL) 200 MG 24 hr tablet Take 200 mg by mouth daily.    [provider]  PARoxetine (PAXIL) 10 MG tablet Take 10 mg by mouth daily.    [provider]  pravastatin (PRAVACHOL) 20 MG tablet Take 20 mg by mouth daily.    [provider]    Allergies  Allergen Reactions  . Morphine Nausea Only and Nausea And Vomiting  . Ace Inhibitors Other (See Comments)    hyperkalemia   . Morphine And Related Nausea And Vomiting  . Sulfamethoxazole-Trimethoprim Nausea And Vomiting  . Allopurinol Diarrhea  . Other Rash    Adhesive bandages- causes skin irritation  and redness    Family History  Problem Relation Age of Onset  . Lung cancer Father   . Colon cancer Neg Hx   . Kidney disease Neg Hx   . Breast cancer Neg Hx     Social History Social History   Tobacco Use  . Smoking status: Never Smoker  . Smokeless tobacco: Never Used  Substance Use Topics  . Alcohol use: No    Alcohol/week: 0.0 oz  . Drug use: No    Review of Systems Constitutional: Negative for fever.  Positive for generalized weakness Eyes: Negative for visual complaints ENT: Negative for recent illness/congestion Cardiovascular: Negative for chest pain. Respiratory: Positive for shortness of breath worsening over the past 4 days Gastrointestinal: Negative for abdominal pain, vomiting and diarrhea. Genitourinary: Negative for urinary compaints Musculoskeletal: Negative for leg pain or swelling Skin: Negative for skin complaints  Neurological: Negative for headache All other ROS negative  ____________________________________________   PHYSICAL EXAM:  VITAL SIGNS: ED Triage Vitals  Enc Vitals Group     BP 02/26/2017 2122 (!) 108/47     Pulse Rate 03/02/2017 2122 93     Resp 03/04/2017 2122 (!) 26     Temp 02/20/2017 2122 99.2 F (37.3 C)     Temp Source 02/09/2017 2122 Oral     SpO2 02/23/2017 2122 (!) 68 %     Weight 02/11/2017 2114 116 lb (52.6 kg)     Height 02/09/2017 2114 5\' 5"  (1.651 m)     Head Circumference --      Peak Flow --      Pain Score 02/14/2017 2113 9     Pain Loc --      Pain Edu? --      Excl. in [place]? --     Constitutional: Alert and oriented. Well appearing and in no distress. Eyes: Normal exam ENT   Head: Normocephalic and atraumatic.   Mouth/Throat: Mucous membranes are moist. Cardiovascular: Normal rate, regular rhythm. No murmur Respiratory: Normal respiratory effort without tachypnea nor retractions. Breath sounds are clear  Gastrointestinal: Soft and nontender. No distention.   Musculoskeletal: Nontender with normal range of motion  in all extremities. No lower extremity tenderness or edema. Neurologic:  Normal speech and language. No gross focal neurologic deficits. Skin:  Skin is warm, dry and intact.  Psychiatric: Mood and affect are normal.  ____________________________________________    EKG  EKG reviewed and interpreted by myself shows normal sinus rhythm at 97 bpm with a narrow QRS, normal axis, normal intervals with nonspecific ST changes.  ____________________________________________    RADIOLOGY  Diffuse opacities consistent with viral or atypical pneumonia.  ____________________________________________   INITIAL IMPRESSION / ASSESSMENT AND PLAN / ED COURSE  Pertinent labs & imaging results that were available during my care of the patient were reviewed by me and considered in my medical decision making (see chart for details).  Patient presents to the emergency department for shortness of breath.  Patient has chronic kidney disease status post nephrectomy due to renal carcinoma, cannot tolerate IV contrast.  We will proceed with a CT scan of the chest to further evaluate.  We will check labs, blood cultures, lactic acid and continue to closely monitor.  Patient is satting 93% currently on 6 L via nasal cannula.  Differential would include pneumonia, multi lobar pneumonia, pulmonary embolism, atypical pulmonary edema.  Patient agreeable to this plan of care.  Currently calm, cooperative and comfortable.  States he feels much better on oxygen denies any current shortness of breath.  CT consistent with a fairly diffuse pneumonia.  We will send a influenza swab.  We will begin treatment with IV Rocephin and IV Zithromax.  Reassuringly patient's lactic acid and white blood cell count are normal.  We will lightly IV hydrate.  Patient will be admitted to the hospital for further treatment.  Patient agreeable to this plan of care.  Patient has renal insufficiency with slight increase of her  baseline.  ____________________________________________   FINAL CLINICAL IMPRESSION(S) / ED DIAGNOSES  Dyspnea Weakness Pneumonia   Harvest Dark, MD 02/13/2017 2241

## 2017-02-18 NOTE — ED Triage Notes (Signed)
Pt states that she "can't get my breath" - she was dx yesterday at [facility] with double pneumonia - she was started on atb - she states at this point she feels that she is getting worse

## 2017-02-19 ENCOUNTER — Other Ambulatory Visit: Payer: Self-pay

## 2017-02-19 ENCOUNTER — Inpatient Hospital Stay (HOSPITAL_COMMUNITY)
Admit: 2017-02-19 | Discharge: 2017-02-19 | Disposition: A | Discharge status: Home / Self Care | Payer: Medicare Other | Attending: Adult Health | Admitting: Adult Health

## 2017-02-19 ENCOUNTER — Inpatient Hospital Stay: Payer: Medicare Other

## 2017-02-19 DIAGNOSIS — N184 Chronic kidney disease, stage 4 (severe): Secondary | ICD-10-CM

## 2017-02-19 DIAGNOSIS — Z79899 Other long term (current) drug therapy: Secondary | ICD-10-CM | POA: Diagnosis not present

## 2017-02-19 DIAGNOSIS — E785 Hyperlipidemia, unspecified: Secondary | ICD-10-CM | POA: Diagnosis present

## 2017-02-19 DIAGNOSIS — J189 Pneumonia, unspecified organism: Secondary | ICD-10-CM | POA: Diagnosis not present

## 2017-02-19 DIAGNOSIS — R918 Other nonspecific abnormal finding of lung field: Secondary | ICD-10-CM | POA: Diagnosis not present

## 2017-02-19 DIAGNOSIS — Z86010 Personal history of colonic polyps: Secondary | ICD-10-CM | POA: Diagnosis not present

## 2017-02-19 DIAGNOSIS — J154 Pneumonia due to other streptococci: Secondary | ICD-10-CM | POA: Diagnosis present

## 2017-02-19 DIAGNOSIS — M109 Gout, unspecified: Secondary | ICD-10-CM | POA: Diagnosis present

## 2017-02-19 DIAGNOSIS — M810 Age-related osteoporosis without current pathological fracture: Secondary | ICD-10-CM | POA: Diagnosis present

## 2017-02-19 DIAGNOSIS — D649 Anemia, unspecified: Secondary | ICD-10-CM | POA: Diagnosis not present

## 2017-02-19 DIAGNOSIS — Z66 Do not resuscitate: Secondary | ICD-10-CM | POA: Diagnosis not present

## 2017-02-19 DIAGNOSIS — Z923 Personal history of irradiation: Secondary | ICD-10-CM | POA: Diagnosis not present

## 2017-02-19 DIAGNOSIS — Z515 Encounter for palliative care: Secondary | ICD-10-CM | POA: Diagnosis not present

## 2017-02-19 DIAGNOSIS — E440 Moderate protein-calorie malnutrition: Secondary | ICD-10-CM | POA: Diagnosis not present

## 2017-02-19 DIAGNOSIS — J9601 Acute respiratory failure with hypoxia: Secondary | ICD-10-CM

## 2017-02-19 DIAGNOSIS — N179 Acute kidney failure, unspecified: Secondary | ICD-10-CM | POA: Diagnosis present

## 2017-02-19 DIAGNOSIS — Z905 Acquired absence of kidney: Secondary | ICD-10-CM | POA: Diagnosis not present

## 2017-02-19 DIAGNOSIS — I313 Pericardial effusion (noninflammatory): Secondary | ICD-10-CM | POA: Diagnosis present

## 2017-02-19 DIAGNOSIS — R627 Adult failure to thrive: Secondary | ICD-10-CM | POA: Diagnosis present

## 2017-02-19 DIAGNOSIS — Z853 Personal history of malignant neoplasm of breast: Secondary | ICD-10-CM | POA: Diagnosis not present

## 2017-02-19 DIAGNOSIS — E039 Hypothyroidism, unspecified: Secondary | ICD-10-CM | POA: Diagnosis present

## 2017-02-19 DIAGNOSIS — I340 Nonrheumatic mitral (valve) insufficiency: Secondary | ICD-10-CM | POA: Diagnosis not present

## 2017-02-19 DIAGNOSIS — J849 Interstitial pulmonary disease, unspecified: Secondary | ICD-10-CM | POA: Diagnosis present

## 2017-02-19 DIAGNOSIS — N186 End stage renal disease: Secondary | ICD-10-CM | POA: Diagnosis not present

## 2017-02-19 DIAGNOSIS — Z8711 Personal history of peptic ulcer disease: Secondary | ICD-10-CM | POA: Diagnosis not present

## 2017-02-19 DIAGNOSIS — I132 Hypertensive heart and chronic kidney disease with heart failure and with stage 5 chronic kidney disease, or end stage renal disease: Secondary | ICD-10-CM | POA: Diagnosis present

## 2017-02-19 DIAGNOSIS — Z85528 Personal history of other malignant neoplasm of kidney: Secondary | ICD-10-CM | POA: Diagnosis not present

## 2017-02-19 DIAGNOSIS — I5033 Acute on chronic diastolic (congestive) heart failure: Secondary | ICD-10-CM | POA: Diagnosis present

## 2017-02-19 DIAGNOSIS — Z681 Body mass index (BMI) 19 or less, adult: Secondary | ICD-10-CM | POA: Diagnosis not present

## 2017-02-19 DIAGNOSIS — Z9049 Acquired absence of other specified parts of digestive tract: Secondary | ICD-10-CM | POA: Diagnosis not present

## 2017-02-19 DIAGNOSIS — R0602 Shortness of breath: Secondary | ICD-10-CM | POA: Diagnosis present

## 2017-02-19 LAB — BASIC METABOLIC PANEL
ANION GAP: 12 (ref 5–15)
BUN: 68 mg/dL — ABNORMAL HIGH (ref 6–20)
CHLORIDE: 101 mmol/L (ref 101–111)
CO2: 23 mmol/L (ref 22–32)
Calcium: 8.8 mg/dL — ABNORMAL LOW (ref 8.9–10.3)
Creatinine, Ser: 3.61 mg/dL — ABNORMAL HIGH (ref 0.44–1.00)
GFR, EST AFRICAN AMERICAN: 13 mL/min — AB (ref >60)
GFR, EST NON AFRICAN AMERICAN: 11 mL/min — AB (ref >60)
Glucose, Bld: 123 mg/dL — ABNORMAL HIGH (ref 65–99)
POTASSIUM: 4.2 mmol/L (ref 3.5–5.1)
SODIUM: 136 mmol/L (ref 135–145)

## 2017-02-19 LAB — CBC
HEMATOCRIT: 26.3 % — AB (ref 35.0–47.0)
Hemoglobin: 9 g/dL — ABNORMAL LOW (ref 12.0–16.0)
MCH: 29.5 pg (ref 26.0–34.0)
MCHC: 34.3 g/dL (ref 32.0–36.0)
MCV: 85.9 fL (ref 80.0–100.0)
Platelets: 424 10*3/uL (ref 150–440)
RBC: 3.06 MIL/uL — AB (ref 3.80–5.20)
RDW: 14 % (ref 11.5–14.5)
WBC: 7.8 10*3/uL (ref 3.6–11.0)

## 2017-02-19 LAB — PROTEIN / CREATININE RATIO, URINE
Creatinine, Urine: 67 mg/dL
Protein Creatinine Ratio: 1.12 mg/mg{Cre} — ABNORMAL HIGH (ref 0.00–0.15)
TOTAL PROTEIN, URINE: 75 mg/dL

## 2017-02-19 LAB — GLUCOSE, CAPILLARY: Glucose-Capillary: 124 mg/dL — ABNORMAL HIGH (ref 65–99)

## 2017-02-19 LAB — ECHOCARDIOGRAM COMPLETE
HEIGHTINCHES: 65 in
WEIGHTICAEL: 1856 [oz_av]

## 2017-02-19 LAB — PHOSPHORUS
PHOSPHORUS: 4.4 mg/dL (ref 2.5–4.6)
PHOSPHORUS: 5.4 mg/dL — AB (ref 2.5–4.6)

## 2017-02-19 LAB — MRSA PCR SCREENING: MRSA BY PCR: NEGATIVE

## 2017-02-19 LAB — LACTIC ACID, PLASMA: Lactic Acid, Venous: 0.7 mmol/L (ref 0.5–1.9)

## 2017-02-19 LAB — MAGNESIUM: Magnesium: 1.7 mg/dL (ref 1.7–2.4)

## 2017-02-19 MED ORDER — DOCUSATE SODIUM 100 MG PO CAPS
100.0000 mg | ORAL_CAPSULE | Freq: Two times a day (BID) | ORAL | Status: DC
  Administered 2017-02-19 – 2017-02-25 (×8): 100 mg via ORAL
  Filled 2017-02-19 (×11): qty 1

## 2017-02-19 MED ORDER — ONDANSETRON HCL 4 MG PO TABS: 4.0000 mg | ORAL_TABLET | Freq: Four times a day (QID) | ORAL | Status: DC | PRN

## 2017-02-19 MED ORDER — HEPARIN SODIUM (PORCINE) 5000 UNIT/ML IJ SOLN
5000.0000 [IU] | Freq: Three times a day (TID) | INTRAMUSCULAR | Status: DC
  Administered 2017-02-19 – 2017-02-25 (×21): 5000 [IU] via SUBCUTANEOUS
  Filled 2017-02-19 (×22): qty 1

## 2017-02-19 MED ORDER — TRAZODONE HCL 50 MG PO TABS
25.0000 mg | ORAL_TABLET | Freq: Every evening | ORAL | Status: DC | PRN
  Administered 2017-02-20 – 2017-02-23 (×4): 25 mg via ORAL
  Filled 2017-02-19 (×4): qty 1

## 2017-02-19 MED ORDER — PAROXETINE HCL 10 MG PO TABS
10.0000 mg | ORAL_TABLET | Freq: Every day | ORAL | Status: DC
  Administered 2017-02-19 – 2017-02-25 (×7): 10 mg via ORAL
  Filled 2017-02-19 (×8): qty 1

## 2017-02-19 MED ORDER — CALCIUM ACETATE (PHOS BINDER) 667 MG PO CAPS
667.0000 mg | ORAL_CAPSULE | Freq: Three times a day (TID) | ORAL | Status: DC
  Administered 2017-02-19 – 2017-02-25 (×18): 667 mg via ORAL
  Filled 2017-02-19 (×24): qty 1

## 2017-02-19 MED ORDER — FERROUS SULFATE 325 (65 FE) MG PO TABS
325.0000 mg | ORAL_TABLET | Freq: Every day | ORAL | Status: DC
  Administered 2017-02-19 – 2017-02-25 (×7): 325 mg via ORAL
  Filled 2017-02-19 (×7): qty 1

## 2017-02-19 MED ORDER — SODIUM CHLORIDE 0.9 % IV SOLN
1.0000 g | INTRAVENOUS | Status: DC
  Administered 2017-02-19 – 2017-02-22 (×4): 1 g via INTRAVENOUS
  Filled 2017-02-19 (×4): qty 10

## 2017-02-19 MED ORDER — PRAVASTATIN SODIUM 20 MG PO TABS
20.0000 mg | ORAL_TABLET | Freq: Every day | ORAL | Status: DC
  Administered 2017-02-19 – 2017-02-25 (×7): 20 mg via ORAL
  Filled 2017-02-19 (×7): qty 1

## 2017-02-19 MED ORDER — LEVOTHYROXINE SODIUM 88 MCG PO TABS
88.0000 ug | ORAL_TABLET | Freq: Every day | ORAL | Status: DC
  Administered 2017-02-19 – 2017-02-25 (×7): 88 ug via ORAL
  Filled 2017-02-19 (×10): qty 1

## 2017-02-19 MED ORDER — HYDROCODONE-ACETAMINOPHEN 5-325 MG PO TABS
1.0000 | ORAL_TABLET | ORAL | Status: DC | PRN
  Administered 2017-02-24: 1 via ORAL
  Filled 2017-02-19: qty 2

## 2017-02-19 MED ORDER — BISACODYL 5 MG PO TBEC: 5.0000 mg | DELAYED_RELEASE_TABLET | Freq: Every day | ORAL | Status: DC | PRN

## 2017-02-19 MED ORDER — IPRATROPIUM-ALBUTEROL 0.5-2.5 (3) MG/3ML IN SOLN
3.0000 mL | Freq: Four times a day (QID) | RESPIRATORY_TRACT | Status: DC
  Administered 2017-02-19 – 2017-02-25 (×23): 3 mL via RESPIRATORY_TRACT
  Filled 2017-02-19 (×23): qty 3

## 2017-02-19 MED ORDER — SODIUM CHLORIDE 0.9 % IV SOLN
INTRAVENOUS | Status: DC
  Administered 2017-02-19: 16:00:00 via INTRAVENOUS

## 2017-02-19 MED ORDER — METHYLPREDNISOLONE SODIUM SUCC 125 MG IJ SOLR
60.0000 mg | Freq: Four times a day (QID) | INTRAMUSCULAR | Status: DC
  Administered 2017-02-19 – 2017-02-20 (×5): 60 mg via INTRAVENOUS
  Filled 2017-02-19 (×5): qty 2

## 2017-02-19 MED ORDER — ACETAMINOPHEN 650 MG RE SUPP: 650.0000 mg | Freq: Four times a day (QID) | RECTAL | Status: DC | PRN

## 2017-02-19 MED ORDER — METOPROLOL SUCCINATE ER 50 MG PO TB24
200.0000 mg | ORAL_TABLET | Freq: Every day | ORAL | Status: DC
  Administered 2017-02-19 – 2017-02-23 (×4): 200 mg via ORAL
  Filled 2017-02-19 (×5): qty 4

## 2017-02-19 MED ORDER — ANASTROZOLE 1 MG PO TABS
1.0000 mg | ORAL_TABLET | Freq: Every day | ORAL | Status: DC
  Administered 2017-02-19 – 2017-02-23 (×5): 1 mg via ORAL
  Filled 2017-02-19 (×6): qty 1

## 2017-02-19 MED ORDER — ACETAMINOPHEN 325 MG PO TABS
650.0000 mg | ORAL_TABLET | Freq: Four times a day (QID) | ORAL | Status: DC | PRN
  Administered 2017-02-19: 650 mg via ORAL
  Filled 2017-02-19: qty 2

## 2017-02-19 MED ORDER — SODIUM CHLORIDE 0.9 % IV SOLN
500.0000 mg | INTRAVENOUS | Status: DC
  Filled 2017-02-19: qty 500

## 2017-02-19 MED ORDER — SODIUM CHLORIDE 0.9 % IV SOLN
INTRAVENOUS | Status: DC
  Administered 2017-02-19: 03:00:00 via INTRAVENOUS

## 2017-02-19 MED ORDER — AZITHROMYCIN 500 MG PO TABS
500.0000 mg | ORAL_TABLET | Freq: Every day | ORAL | Status: AC
  Administered 2017-02-19 – 2017-02-23 (×5): 500 mg via ORAL
  Filled 2017-02-19 (×5): qty 1

## 2017-02-19 MED ORDER — ONDANSETRON HCL 4 MG/2ML IJ SOLN
4.0000 mg | Freq: Four times a day (QID) | INTRAMUSCULAR | Status: DC | PRN
Start: 2017-02-19 — End: 2017-03-01

## 2017-02-19 NOTE — Progress Notes (Signed)
*  PRELIMINARY RESULTS* Echocardiogram 2D Echocardiogram has been performed.  Erin Mccormick 02/19/2017, 2:18 PM

## 2017-02-19 NOTE — Consult Note (Signed)
PULMONARY / CRITICAL CARE MEDICINE   Name: Erin Mccormick MRN: [id number] DOB: 1935/06/04    ADMISSION DATE:  02/17/2017   CONSULTATION DATE:  02/19/2017  REFERRING MD: Dr. Duane Boston  Reason: Acute hypoxic respiratory failure  HISTORY OF PRESENT ILLNESS:   This is a 82 year old Caucasian female with a history of renal cell carcinoma status post nephrectomy, hypertension, hyperlipidemia, CKD, depression and anemia who presented to the ED with progressive shortness of breath times 5 days.  Patient states that symptoms started on Sunday with weakness and progressed to shortness of breath yesterday hence she went to her primary care provider and was diagnosed with pneumonia and started on antibiotics.  She went home and today she became acutely short of breath.  Upon arrival in the ED, patient's SPO2 was 68% on room air.  She is not on home O2 and denies any tobacco use.  She denies any history of congestive heart failure or major heart problems.  Shortness of breath is associated with chest weakness and no other symptoms.  At the ED, [place] chest CT showed diffuse inflammatory changes bilateral pleural effusions and small pericardial effusion.  He is being admitted to the ICU for further management  PAST MEDICAL HISTORY :  She  has a past medical history of Acute pancreatitis, Anemia, Breast cancer ([place]) (left ), Chronic gastritis, CKD (chronic kidney disease), Colon polyp, Depression, Gastric ulcer, Gout, Hyperlipidemia, Hypertension, Osteoporosis, Personal history of radiation therapy (2011), and Renal cell carcinoma ([place]).  PAST SURGICAL HISTORY: She  has a past surgical history that includes Cholecystectomy; Nephrectomy (Left); Thyroid surgery; Lumbar laminectomy; Kidney surgery (Right); Tonsillectomy; Breast excisional biopsy (Right, 2011); and Breast lumpectomy (Left, 2011).  Allergies  Allergen Reactions  . Morphine Nausea Only and Nausea And Vomiting  . Ace Inhibitors Other (See Comments)     hyperkalemia   . Morphine And Related Nausea And Vomiting  . Sulfamethoxazole-Trimethoprim Nausea And Vomiting  . Allopurinol Diarrhea  . Other Rash    Adhesive bandages- causes skin irritation and redness    No current facility-administered medications on file prior to encounter.    Current Outpatient Medications on File Prior to Encounter  Medication Sig  . amLODipine (NORVASC) 10 MG tablet Take 5 mg by mouth daily.   Marland Kitchen anastrozole (ARIMIDEX) 1 MG tablet Take 1 mg by mouth daily.  Marland Kitchen aspirin 81 MG tablet Take 81 mg by mouth daily.  . calcium acetate (PHOSLO) 667 MG capsule Take by mouth 3 (three) times daily with meals.  . Cholecalciferol (VITAMIN D) 2000 UNITS CAPS Take 2,000 capsules by mouth daily.  . diphenoxylate-atropine (LOMOTIL) 2.5-0.025 MG tablet Take 1 tablet by mouth as needed for diarrhea or loose stools.  Marland Kitchen doxycycline (ADOXA) 100 MG tablet Take 100 mg by mouth 2 (two) times daily.  . ferrous sulfate 325 (65 FE) MG tablet Take 325 mg by mouth daily with breakfast.  . furosemide (LASIX) 40 MG tablet Take 40 mg by mouth daily.  Marland Kitchen levothyroxine (SYNTHROID, LEVOTHROID) 88 MCG tablet Take 88 mcg by mouth daily.  . metoprolol (TOPROL-XL) 200 MG 24 hr tablet Take 200 mg by mouth daily.  Marland Kitchen PARoxetine (PAXIL) 10 MG tablet Take 10 mg by mouth daily.  . pravastatin (PRAVACHOL) 20 MG tablet Take 20 mg by mouth daily.    FAMILY HISTORY:  Her indicated that her mother is deceased. She indicated that her father is deceased. She indicated that the status of her neg hx is unknown.   SOCIAL HISTORY: She  reports that  has never smoked. she has never used smokeless tobacco. She reports that she does not drink alcohol or use drugs.  REVIEW OF SYSTEMS:   Constitutional: Negative for fever and chills but positive for generalized malaise.  HENT: Negative for congestion and rhinorrhea.  Eyes: Negative for redness and visual disturbance.  Respiratory: Positive for shortness of breath  negative for wheezing.  Cardiovascular: Negative for chest pain and palpitations.  Gastrointestinal: Negative  for nausea , vomiting and abdominal pain and  Loose stools Genitourinary: Negative for dysuria and urgency.  Endocrine: Denies polyuria, polyphagia and heat intolerance Musculoskeletal: Negative for myalgias and arthralgias.  Skin: Negative for pallor and wound.  Neurological: Negative for dizziness and headaches   SUBJECTIVE:  VITAL SIGNS: BP (!) 121/56   Pulse 86   Temp 99.2 F (37.3 C) (Oral)   Resp (!) 33   Ht 5\' 5"  (1.651 m)   Wt 116 lb (52.6 kg)   SpO2 94%   BMI 19.30 kg/m   HEMODYNAMICS:    VENTILATOR SETTINGS:    INTAKE / OUTPUT: No intake/output data recorded.  PHYSICAL EXAMINATION: General: Chronically ill looking, no acute distress Neuro: Alert and oriented x4, cranial nerves intact HEENT: PERRLA, trachea midline, no JVD Cardiovascular:  RRR, S1-S2, no murmur regurg or gallop, +2 pulses, no edema Lungs: Bilateral breath sounds with diffuse rhonchi in all lung fields right greater than left, breath sounds diminished in the bases, positive Rales bilaterally Abdomen: Abdomen is soft, nontender, nondistended, normal bowel sounds, positive aortic bruit Musculoskeletal: Positive range of motion in upper and lower extremities no deformities Skin: Warm and dry  LABS:  BMET Recent Labs  Lab 02/15/2017 2129 02/19/17 0330  NA 133* 136  K 4.3 4.2  CL 97* 101  CO2 24 23  BUN 70* 68*  CREATININE 3.51* 3.61*  GLUCOSE 136* 123*    Electrolytes Recent Labs  Lab 02/09/2017 2129 02/19/17 0330  CALCIUM 9.0 8.8*    CBC Recent Labs  Lab 02/19/2017 2129 02/19/17 0330  WBC 7.3 7.8  HGB 9.9* 9.0*  HCT 29.6* 26.3*  PLT 478* 424    Coag's No results for input(s): APTT, INR in the last 168 hours.  Sepsis Markers Recent Labs  Lab 02/11/2017 2129 02/19/17 0330  LATICACIDVEN 1.1 0.7    ABG No results for input(s): PHART, PCO2ART, PO2ART in the  last 168 hours.  Liver Enzymes No results for input(s): AST, ALT, ALKPHOS, BILITOT, ALBUMIN in the last 168 hours.  Cardiac Enzymes Recent Labs  Lab 02/23/2017 2129  TROPONINI <0.03    Glucose Recent Labs  Lab 02/19/17 0211  GLUCAP 124*    Imaging Ct Chest Wo Contrast  Result Date: 02/05/2017 CLINICAL DATA:  82 year old female with shortness of Breath. Diagnosed with pneumonia yesterday. EXAM: CT CHEST WITHOUT CONTRAST TECHNIQUE: Multidetector CT imaging of the chest was performed following the standard protocol without IV contrast. COMPARISON:  CT Abdomen and Pelvis 08/05/2015.  Chest CT 04/28/2012. FINDINGS: Cardiovascular: Calcified aortic atherosclerosis. Calcified coronary artery atherosclerosis. Vascular patency is not evaluated in the absence of IV contrast. There is a small pericardial effusion which is new since 2017 (series 2, image 98). Stable mild cardiomegaly. Mediastinum/Nodes: Mildly increased, reactive appearing mediastinal lymph nodes. Lungs/Pleura: Small bilateral layering pleural effusions are new since 2017. Diffuse bilateral confluent abnormal pulmonary ground-glass opacity in all lobes, relatively sparing the lung apices and the anterior basal segment of the left lower lobe. In some areas the peribronchial opacity is more solid. No  overt consolidation at this time. Major airways remain patent. Upper Abdomen: Surgically absent gallbladder. Negative visible noncontrast liver, spleen, pancreas and right kidney. Surgically absent left kidney. Calcified aortic atherosclerosis. Musculoskeletal: No acute osseous abnormality identified. IMPRESSION: 1. Diffuse Bilateral mostly ground-glass pulmonary opacity compatible with Acute Viral/Atypical Pneumonia. Top differential considerations would include Mycoplasma, Influenza. 2. Associated small bilateral layering Pleural Effusions, and also a small Pericardial Effusion. Mild cardiomegaly is stable. 3. Calcified coronary artery and  Aortic Atherosclerosis (ICD10-I70.0). Electronically Signed   By: Genevie Ann M.D.   On: 02/15/2017 22:31   STUDIES:  2D echo  CULTURES: Blood cultures x2  ANTIBIOTICS: Ceftriaxone Azithromycin  SIGNIFICANT EVENTS: 02/11/2017: Admitted  LINES/TUBES: Peripheral IVs  DISCUSSION: 82 year old female presenting with multifocal pneumonia and diffuse pulmonary fibrosis; exact etiology unclear, and worsening CKD  ASSESSMENT  Acute hypoxic respiratory failure Multifocal pneumonia Diffuse pulmonary fibrosis Worsening CKD History of renal cell carcinoma status post nephrectomy History of hypertension, hyperlipidemia, depression, and gastritis  PLAN Hemodynamics per ICU protocol Supplemental oxygen as needed to maintain SPO2 greater than 90% IV steroids Nebulized bronchodilators Legionella and strep pneumonia urine antigen Antibiotics as above Discontinue IV fluids Nephrology consult Monitor intake and output 2D echo to evaluate pericardial effusion and left ventricular ejection fraction Resume all home medications GI and DVT prophylaxis  FAMILY  - Updates: Patient updated on current treatment plan.  - Inter-disciplinary family meet or Palliative Care meeting due by:  day 7 Kayhan Boardley S. [facility] ANP-BC Pulmonary and Critical Care Medicine [facility] Pager [phone] or [phone]  NB: This document was prepared using Dragon voice recognition software and may include unintentional dictation errors.    02/19/2017, 4:40 AM

## 2017-02-19 NOTE — Progress Notes (Signed)
PHARMACIST - PHYSICIAN COMMUNICATION CONCERNING: Antibiotic IV to Oral Route Change Policy  RECOMMENDATION: This patient is receiving azithromycin by the intravenous route.  Based on criteria approved by the Pharmacy and Therapeutics Committee, the antibiotic(s) is/are being converted to the equivalent oral dose form(s).   DESCRIPTION: These criteria include:  Patient being treated for a respiratory tract infection, urinary tract infection, cellulitis or clostridium difficile associated diarrhea if on metronidazole  The patient is not neutropenic and does not exhibit a GI malabsorption state  The patient is eating (either orally or via tube) and/or has been taking other orally administered medications for a least 24 hours  The patient is improving clinically and has a Tmax < 100.5  If you have questions about this conversion, please contact the Pharmacy Department  []  ( [phone] )  [place] [x]  ( [phone] )  [facility] []  ( [phone] )  [place] []  ( [phone] )  [facility] []  ( [phone] )  [facility]  

## 2017-02-19 NOTE — ED Notes (Signed)
Patient Husband [phone]

## 2017-02-19 NOTE — H&P (Signed)
[facility] NAME: Erin Mccormick    MR#:  [id number]  DATE OF BIRTH:  25-Sep-1935  DATE OF ADMISSION:  02/23/2017  PRIMARY CARE PHYSICIAN: Dion Body, MD   REQUESTING/REFERRING PHYSICIAN:   CHIEF COMPLAINT:   Chief Complaint  Patient presents with  . Shortness of Breath    HISTORY OF PRESENT ILLNESS: Erin Mccormick  is a 82 y.o. female with a known history of renal cell carcinoma status post nephrectomy, CKD 4, hypertension, hyperlipidemia. She presented to emergency room for generalized weakness and shortness of breath this started acutely, 3 days ago. Patient did not checked her temperature at home but she admits to subjective fever and chills.  She was seen by her primary care physician 2 days ago and was started on doxycycline p.o. for pneumonia there was noted on the chest x-ray.  Despite the antibiotic and over-the-counter medication use, patient continued to feel weaker and more short of breath, so she came to the hospital. At the arrival to emergency room, patient was noted to be hypoxic; oxygen saturation was 68% in room air.  Chest x-ray showed  diffuse Bilateral mostly ground-glass pulmonary opacity compatible with Acute Viral/Atypical Pneumonia. Associated small bilateral layering Pleural Effusions, and also a small Pericardial Effusion are noted. Patient is admitted for further evaluation and treatment.  PAST MEDICAL HISTORY:   Past Medical History:  Diagnosis Date  . Acute pancreatitis   . Anemia   . Breast cancer ([place]) left    NO BLOOD PRESSURES OR STICK IN LEFT ARM  . Chronic gastritis   . CKD (chronic kidney disease)   . Colon polyp   . Depression   . Gastric ulcer   . Gout   . Hyperlipidemia   . Hypertension   . Osteoporosis   . Personal history of radiation therapy 2011   BREAST CA  . Renal cell carcinoma ([place])    left    PAST SURGICAL HISTORY:  Past Surgical History:  Procedure Laterality  Date  . BREAST EXCISIONAL BIOPSY Right 2011   NEG  . BREAST LUMPECTOMY Left 2011   BREAST CA  . CHOLECYSTECTOMY    . KIDNEY SURGERY Right   . LUMBAR LAMINECTOMY    . NEPHRECTOMY Left   . THYROID SURGERY    . TONSILLECTOMY      SOCIAL HISTORY:  Social History   Tobacco Use  . Smoking status: Never Smoker  . Smokeless tobacco: Never Used  Substance Use Topics  . Alcohol use: No    Alcohol/week: 0.0 oz    FAMILY HISTORY:  Family History  Problem Relation Age of Onset  . Lung cancer Father   . Colon cancer Neg Hx   . Kidney disease Neg Hx   . Breast cancer Neg Hx     DRUG ALLERGIES:  Allergies  Allergen Reactions  . Morphine Nausea Only and Nausea And Vomiting  . Ace Inhibitors Other (See Comments)    hyperkalemia   . Morphine And Related Nausea And Vomiting  . Sulfamethoxazole-Trimethoprim Nausea And Vomiting  . Allopurinol Diarrhea  . Other Rash    Adhesive bandages- causes skin irritation and redness    REVIEW OF SYSTEMS:   CONSTITUTIONAL: Positive for subjective fever and chills, fatigue and generalized weakness.  EYES: No blurred or double vision.  EARS, NOSE, AND THROAT: No tinnitus or ear pain.  RESPIRATORY: Positive for shortness of breath; no cough, wheezing or hemoptysis.  CARDIOVASCULAR: No chest pain, orthopnea,  edema.  GASTROINTESTINAL: No nausea, vomiting, diarrhea or abdominal pain.  GENITOURINARY: No dysuria, hematuria.  ENDOCRINE: No polyuria, nocturia,  HEMATOLOGY: No bleeding SKIN: No rash or lesion. MUSCULOSKELETAL: No joint pain.   NEUROLOGIC: No focal weakness.  PSYCHIATRY: No anxiety or depression.   MEDICATIONS AT HOME:  Prior to Admission medications   Medication Sig Start Date End Date Taking? Authorizing Provider  amLODipine (NORVASC) 10 MG tablet Take 5 mg by mouth daily.    Yes [provider]  anastrozole (ARIMIDEX) 1 MG tablet Take 1 mg by mouth daily.   Yes [provider]  aspirin 81 MG tablet Take 81 mg  by mouth daily.   Yes [provider]  calcium acetate (PHOSLO) 667 MG capsule Take by mouth 3 (three) times daily with meals.   Yes [provider]  Cholecalciferol (VITAMIN D) 2000 UNITS CAPS Take 2,000 capsules by mouth daily.   Yes [provider]  diphenoxylate-atropine (LOMOTIL) 2.5-0.025 MG tablet Take 1 tablet by mouth as needed for diarrhea or loose stools. 10/01/16  Yes Pyrtle, Lajuan Lines, MD  doxycycline (ADOXA) 100 MG tablet Take 100 mg by mouth 2 (two) times daily.   Yes [provider]  ferrous sulfate 325 (65 FE) MG tablet Take 325 mg by mouth daily with breakfast.   Yes [provider]  furosemide (LASIX) 40 MG tablet Take 40 mg by mouth daily.   Yes [provider]  levothyroxine (SYNTHROID, LEVOTHROID) 88 MCG tablet Take 88 mcg by mouth daily.   Yes [provider]  metoprolol (TOPROL-XL) 200 MG 24 hr tablet Take 200 mg by mouth daily.   Yes [provider]  PARoxetine (PAXIL) 10 MG tablet Take 10 mg by mouth daily.   Yes [provider]  pravastatin (PRAVACHOL) 20 MG tablet Take 20 mg by mouth daily.   Yes [provider]      PHYSICAL EXAMINATION:   VITAL SIGNS: Blood pressure (!) 125/54, pulse 76, temperature 99.2 F (37.3 C), temperature source Oral, resp. rate (!) 31, height 5\' 5"  (1.651 m), weight 52.6 kg (116 lb), SpO2 96 %.  GENERAL:  82 y.o.-year-old patient lying in the bed, in moderate distress, secondary to shortness of breath.  Patient looks exhausted, acutely ill, but nontoxic. EYES: Pupils equal, round, reactive to light and accommodation. No scleral icterus. Extraocular muscles intact.  HEENT: Head atraumatic, normocephalic. Oropharynx and nasopharynx clear.  NECK:  Supple, no jugular venous distention. No thyroid enlargement, no tenderness.  LUNGS: Reduced breath sounds bilaterally, no wheezing. No use of accessory muscles of respiration.  CARDIOVASCULAR: S1, S2 normal. No  S3/S4.  ABDOMEN: Soft, nontender, nondistended. Bowel sounds present. No organomegaly or mass.  EXTREMITIES: No pedal edema, cyanosis, or clubbing.  NEUROLOGIC: No focal weakness. Gait not checked, as patient is too weak to ambulate.  PSYCHIATRIC: The patient is alert and oriented x 3.  SKIN: No obvious rash, lesion, or ulcer.   LABORATORY PANEL:   CBC Recent Labs  Lab 02/14/2017 2129 02/19/17 0330  WBC 7.3 7.8  HGB 9.9* 9.0*  HCT 29.6* 26.3*  PLT 478* 424  MCV 86.7 85.9  MCH 28.9 29.5  MCHC 33.3 34.3  RDW 13.5 14.0   ------------------------------------------------------------------------------------------------------------------  Chemistries  Recent Labs  Lab 02/26/2017 2129 02/19/17 0330  NA 133* 136  K 4.3 4.2  CL 97* 101  CO2 24 23  GLUCOSE 136* 123*  BUN 70* 68*  CREATININE 3.51* 3.61*  CALCIUM 9.0 8.8*   ------------------------------------------------------------------------------------------------------------------  estimated creatinine clearance is 10.5 mL/min (A) (by C-G formula based on SCr of 3.61 mg/dL (H)). ------------------------------------------------------------------------------------------------------------------ No results for input(s): TSH, T4TOTAL, T3FREE, THYROIDAB in the last 72 hours.  Invalid input(s): FREET3   Coagulation profile No results for input(s): INR, PROTIME in the last 168 hours. ------------------------------------------------------------------------------------------------------------------- No results for input(s): DDIMER in the last 72 hours. -------------------------------------------------------------------------------------------------------------------  Cardiac Enzymes Recent Labs  Lab 02/16/2017 2129  TROPONINI <0.03   ------------------------------------------------------------------------------------------------------------------ Invalid input(s):  POCBNP  ---------------------------------------------------------------------------------------------------------------  Urinalysis    Component Value Date/Time   COLORURINE STRAW (A) 07/01/2016 0424   APPEARANCEUR CLEAR (A) 07/01/2016 0424   APPEARANCEUR Clear 07/12/2011 1650   LABSPEC 1.008 07/01/2016 0424   LABSPEC 1.003 07/12/2011 1650   PHURINE 6.0 07/01/2016 0424   GLUCOSEU NEGATIVE 07/01/2016 0424   GLUCOSEU Negative 07/12/2011 1650   HGBUR SMALL (A) 07/01/2016 0424   BILIRUBINUR NEGATIVE 07/01/2016 0424   BILIRUBINUR Negative 07/12/2011 1650   KETONESUR NEGATIVE 07/01/2016 0424   PROTEINUR 100 (A) 07/01/2016 0424   NITRITE NEGATIVE 07/01/2016 0424   LEUKOCYTESUR NEGATIVE 07/01/2016 0424   LEUKOCYTESUR Negative 07/12/2011 1650     RADIOLOGY: Ct Chest Wo Contrast  Result Date: 02/16/2017 CLINICAL DATA:  82 year old female with shortness of Breath. Diagnosed with pneumonia yesterday. EXAM: CT CHEST WITHOUT CONTRAST TECHNIQUE: Multidetector CT imaging of the chest was performed following the standard protocol without IV contrast. COMPARISON:  CT Abdomen and Pelvis 08/05/2015.  Chest CT 04/28/2012. FINDINGS: Cardiovascular: Calcified aortic atherosclerosis. Calcified coronary artery atherosclerosis. Vascular patency is not evaluated in the absence of IV contrast. There is a small pericardial effusion which is new since 2017 (series 2, image 98). Stable mild cardiomegaly. Mediastinum/Nodes: Mildly increased, reactive appearing mediastinal lymph nodes. Lungs/Pleura: Small bilateral layering pleural effusions are new since 2017. Diffuse bilateral confluent abnormal pulmonary ground-glass opacity in all lobes, relatively sparing the lung apices and the anterior basal segment of the left lower lobe. In some areas the peribronchial opacity is more solid. No overt consolidation at this time. Major airways remain patent. Upper Abdomen: Surgically absent gallbladder. Negative visible  noncontrast liver, spleen, pancreas and right kidney. Surgically absent left kidney. Calcified aortic atherosclerosis. Musculoskeletal: No acute osseous abnormality identified. IMPRESSION: 1. Diffuse Bilateral mostly ground-glass pulmonary opacity compatible with Acute Viral/Atypical Pneumonia. Top differential considerations would include Mycoplasma, Influenza. 2. Associated small bilateral layering Pleural Effusions, and also a small Pericardial Effusion. Mild cardiomegaly is stable. 3. Calcified coronary artery and Aortic Atherosclerosis (ICD10-I70.0). Electronically Signed   By: Genevie Ann M.D.   On: 02/05/2017 22:31    EKG: Orders placed or performed during the hospital encounter of 02/12/2017  . ED EKG within 10 minutes  . ED EKG within 10 minutes  . EKG 12-Lead  . EKG 12-Lead    IMPRESSION AND PLAN:  1.  Acute hypoxemic respiratory failure, secondary to pneumonia.  Oxygen saturation is currently 91% on 5 L per nasal cannula.  Continue antibiotics IV, fluids resuscitation, O2 nebulizers, as needed.  Transfer patient to stepdown unit for further close monitoring and treatment.   2.  Bilateral pneumonia, community-acquired, likely atypical per imaging.  We will start treatment with ceftriaxone and azithromycin IV.  Continue treatment with oxygen and nebulizers, as needed. 3.  Acute renal failure on chronic kidney disease stage IV.  Creatinine level is elevated at 3.51. Baseline creatinine is around 2.7.  We will start IV fluids and monitor kidney function closely.  Avoid nephrotoxic medication. 4. HTN, stable we will restart home medications.  All  the records are reviewed and case discussed with ED provider. Management plans discussed with the patient, family and they are in agreement.  CODE STATUS:    Code Status Orders  (From admission, onward)        Start     Ordered   02/19/17 0226  Full code  Continuous     02/19/17 0225    Code Status History    Date Active Date Inactive Code  Status Order ID Comments User Context   07/01/2016 08:53 07/02/2016 14:29 Full Code [id number]  Saundra Shelling, MD Inpatient    Advance Directive Documentation     Most Recent Value  Type of Advance Directive  Living will  Pre-existing out of facility DNR order (yellow form or pink MOST form)  No data  "MOST" Form in Place?  No data       TOTAL TIME TAKING CARE OF THIS PATIENT: 40 minutes.    Amelia Jo M.D on 02/19/2017 at 5:35 AM  Between 7am to 6pm - Pager - [phone]  After 6pm go to [email or web address] - password [email or web address] [facility]  Office  [phone]  CC: Primary care physician; Dion Body, MD

## 2017-02-19 NOTE — Progress Notes (Signed)
Patient feels better, on oxygen by nasal cannula 6 L. Vital signs reviewed Physical examinations show diminished lung sounds and poor skin turgor. 1.  Acute hypoxemic respiratory failure, secondary to pneumonia.  Continue oxygen by nasal counter.  Continue antibiotics IV, fluids resuscitation, O2 nebulizers, as needed.   2.  Bilateral pneumonia, community-acquired, likely atypical per imaging.    Continue ceftriaxone and azithromycin IV.  Continue treatment with oxygen and nebulizers, as needed.  3.  Acute renal failure on chronic kidney disease stage IV.  Creatinine level is elevated at 3.51. Baseline creatinine is around 2.7.  Continue IV fluids and monitor kidney function closely.  Avoid nephrotoxic medication. 4. HTN, stable we will restart home medications.  Discussed with the patient and her husband.  Time spent about 33 minutes.

## 2017-02-19 NOTE — ED Notes (Signed)
Admitting MD at bedside.

## 2017-02-19 NOTE — Consult Note (Signed)
[facility] CONSULT NOTE    Date: 02/19/2017                  Patient Name:  Erin Mccormick  MRN: [id number]  DOB: 09/04/1935  Age / Sex: 82 y.o., female         PCP: Dion Body, MD                 Service Requesting Consult: Hospitalist                 Reason for Consult: Acute renal failure/CKD stage IV            History of Present Illness: Patient is a 82 y.o. female with a PMHx of renal cell carcinoma status post left nephrectomy and partial right nephrectomy, hypertension, hyperlipidemia, anemia of chronic kidney disease, secondary hyperparathyroidism, osteoporosis, history of breast cancer, who was admitted to [facility] on 02/10/2017 for evaluation of significant shortness of breath.  Patient has had progressive shortness of breath over the past several days.  She did have chills.  Apparently she was started on p.o. doxycycline as an outpatient.  However her condition continued to worsen.  Upon arrival here she was found to be hypoxemic with oxygen saturation of 68%.  She was also found to have pulmonary opacities bilateral.  She was subsequently admitted.  We are asked to see her for evaluation management of acute renal failure in the setting of known chronic kidney disease stage IV.  Her creatinine currently is 3.6.  Recently as an outpatient her creatinine has been 3.0.  She is followed by [facility] nephrology.   Medications: Outpatient medications: Medications Prior to Admission  Medication Sig Dispense Refill Last Dose  . amLODipine (NORVASC) 10 MG tablet Take 5 mg by mouth daily.    02/11/2017 at Unknown time  . anastrozole (ARIMIDEX) 1 MG tablet Take 1 mg by mouth daily.   02/05/2017 at Unknown time  . aspirin 81 MG tablet Take 81 mg by mouth daily.   02/09/2017 at Unknown time  . calcium acetate (PHOSLO) 667 MG capsule Take by mouth 3 (three) times daily with meals.   02/24/2017 at Unknown time  . Cholecalciferol (VITAMIN D) 2000 UNITS CAPS Take 2,000  capsules by mouth daily.   03/03/2017 at Unknown time  . diphenoxylate-atropine (LOMOTIL) 2.5-0.025 MG tablet Take 1 tablet by mouth as needed for diarrhea or loose stools. 30 tablet 3 prn at prn  . doxycycline (ADOXA) 100 MG tablet Take 100 mg by mouth 2 (two) times daily.   02/13/2017 at Unknown time  . ferrous sulfate 325 (65 FE) MG tablet Take 325 mg by mouth daily with breakfast.   02/09/2017 at Unknown time  . furosemide (LASIX) 40 MG tablet Take 40 mg by mouth daily.   02/13/2017 at Unknown time  . levothyroxine (SYNTHROID, LEVOTHROID) 88 MCG tablet Take 88 mcg by mouth daily.   02/14/2017 at Unknown time  . metoprolol (TOPROL-XL) 200 MG 24 hr tablet Take 200 mg by mouth daily.   03/02/2017 at Unknown time  . PARoxetine (PAXIL) 10 MG tablet Take 10 mg by mouth daily.   02/07/2017 at Unknown time  . pravastatin (PRAVACHOL) 20 MG tablet Take 20 mg by mouth daily.   03/02/2017 at Unknown time    Current medications: Current Facility-Administered Medications  Medication Dose Route Frequency Provider Last Rate Last Dose  . acetaminophen (TYLENOL) tablet 650 mg  650 mg Oral Q6H PRN Amelia Jo, MD  650 mg at 02/19/17 0735   Or  . acetaminophen (TYLENOL) suppository 650 mg  650 mg Rectal Q6H PRN Amelia Jo, MD      . anastrozole (ARIMIDEX) tablet 1 mg  1 mg Oral Daily Amelia Jo, MD   1 mg at 02/19/17 1058  . azithromycin (ZITHROMAX) 500 mg in sodium chloride 0.9 % 250 mL IVPB  500 mg Intravenous Q24H Amelia Jo, MD      . bisacodyl (DULCOLAX) EC tablet 5 mg  5 mg Oral Daily PRN Amelia Jo, MD      . calcium acetate (PHOSLO) capsule 667 mg  667 mg Oral TID WC Amelia Jo, MD   667 mg at 02/19/17 0735  . cefTRIAXone (ROCEPHIN) 1 g in sodium chloride 0.9 % 100 mL IVPB  1 g Intravenous Q24H Amelia Jo, MD 200 mL/hr at 02/19/17 1100 1 g at 02/19/17 1100  . docusate sodium (COLACE) capsule 100 mg  100 mg Oral BID Amelia Jo, MD   100 mg at 02/19/17 1058  . ferrous sulfate tablet 325  mg  325 mg Oral Q breakfast Amelia Jo, MD   325 mg at 02/19/17 0735  . heparin injection 5,000 Units  5,000 Units Subcutaneous Q8H Amelia Jo, MD   5,000 Units at 02/19/17 1058  . HYDROcodone-acetaminophen (NORCO/VICODIN) 5-325 MG per tablet 1-2 tablet  1-2 tablet Oral Q4H PRN Amelia Jo, MD      . ipratropium-albuterol (DUONEB) 0.5-2.5 (3) MG/3ML nebulizer solution 3 mL  3 mL Nebulization Q6H Tukov, Magadalene S, NP   3 mL at 02/19/17 0813  . levothyroxine (SYNTHROID, LEVOTHROID) tablet 88 mcg  88 mcg Oral QAC breakfast Amelia Jo, MD   88 mcg at 02/19/17 [id number]  . methylPREDNISolone sodium succinate (SOLU-MEDROL) 125 mg/2 mL injection 60 mg  60 mg Intravenous Q6H Tukov, Magadalene S, NP   60 mg at 02/19/17 1100  . metoprolol succinate (TOPROL-XL) 24 hr tablet 200 mg  200 mg Oral Daily Amelia Jo, MD   200 mg at 02/19/17 1059  . ondansetron (ZOFRAN) tablet 4 mg  4 mg Oral Q6H PRN Amelia Jo, MD       Or  . ondansetron [facility]) injection 4 mg  4 mg Intravenous Q6H PRN Amelia Jo, MD      . PARoxetine (PAXIL) tablet 10 mg  10 mg Oral Daily Amelia Jo, MD   10 mg at 02/19/17 1059  . pravastatin (PRAVACHOL) tablet 20 mg  20 mg Oral Daily Amelia Jo, MD      . traZODone (DESYREL) tablet 25 mg  25 mg Oral QHS PRN Amelia Jo, MD          Allergies: Allergies  Allergen Reactions  . Morphine Nausea Only and Nausea And Vomiting  . Ace Inhibitors Other (See Comments)    hyperkalemia   . Morphine And Related Nausea And Vomiting  . Sulfamethoxazole-Trimethoprim Nausea And Vomiting  . Allopurinol Diarrhea  . Other Rash    Adhesive bandages- causes skin irritation and redness      Past Medical History: Past Medical History:  Diagnosis Date  . Acute pancreatitis   . Anemia   . Breast cancer ([place]) left    NO BLOOD PRESSURES OR STICK IN LEFT ARM  . Chronic gastritis   . CKD (chronic kidney disease)   . Colon polyp   . Depression   . Gastric ulcer   . Gout   .  Hyperlipidemia   . Hypertension   . Osteoporosis   .  Personal history of radiation therapy 2011   BREAST CA  . Renal cell carcinoma ([place])    left     Past Surgical History: Past Surgical History:  Procedure Laterality Date  . BREAST EXCISIONAL BIOPSY Right 2011   NEG  . BREAST LUMPECTOMY Left 2011   BREAST CA  . CHOLECYSTECTOMY    . KIDNEY SURGERY Right   . LUMBAR LAMINECTOMY    . NEPHRECTOMY Left   . THYROID SURGERY    . TONSILLECTOMY       Family History: Family History  Problem Relation Age of Onset  . Lung cancer Father   . Colon cancer Neg Hx   . Kidney disease Neg Hx   . Breast cancer Neg Hx      Social History: Social History   Socioeconomic History  . Marital status: Married    Spouse name: Not on file  . Number of children: Not on file  . Years of education: Not on file  . Highest education level: Not on file  Social Needs  . Financial resource strain: Not on file  . Food insecurity - worry: Not on file  . Food insecurity - inability: Not on file  . Transportation needs - medical: Not on file  . Transportation needs - non-medical: Not on file  Occupational History  . Occupation: Retired  Tobacco Use  . Smoking status: Never Smoker  . Smokeless tobacco: Never Used  Substance and Sexual Activity  . Alcohol use: No    Alcohol/week: 0.0 oz  . Drug use: No  . Sexual activity: No  Other Topics Concern  . Not on file  Social History Narrative  . Not on file     Review of Systems: Review of Systems  Constitutional: Positive for chills, fever and malaise/fatigue.  HENT: Negative for ear pain, hearing loss and tinnitus.   Eyes: Negative for blurred vision and double vision.  Respiratory: Positive for cough and shortness of breath.   Cardiovascular: Negative for chest pain, palpitations and orthopnea.  Gastrointestinal: Negative for heartburn, nausea and vomiting.  Genitourinary: Negative for dysuria, frequency and urgency.  Musculoskeletal:  Negative for joint pain and myalgias.  Skin: Negative for rash.  Neurological: Negative for dizziness and focal weakness.  Endo/Heme/Allergies: Negative for polydipsia. Does not bruise/bleed easily.  Psychiatric/Behavioral: Negative for depression. The patient is not nervous/anxious.      Vital Signs: Blood pressure 130/66, pulse 87, temperature 98.9 F (37.2 C), temperature source Oral, resp. rate (!) 25, height 5\' 5"  (1.651 m), weight 52.6 kg (116 lb), SpO2 94 %.  Weight trends: Filed Weights   02/13/2017 2114  Weight: 52.6 kg (116 lb)    Physical Exam: General: NAD, resting in bed  Head: Normocephalic, atraumatic.  Eyes: Anicteric, EOMI  Nose: Mucous membranes moist, not inflammed, nonerythematous.  Throat: Oropharynx nonerythematous, no exudate appreciated.   Neck: Supple, trachea midline.  Lungs:  Normal respiratory effort. Bilateral rales  Heart: RRR. S1 and S2 normal without gallop, murmur, or rubs.  Abdomen:  BS normoactive. Soft, Nondistended, non-tender.  No masses or organomegaly.  Extremities: No pretibial edema.  Neurologic: A&O X3, Motor strength is 5/5 in the all 4 extremities  Skin: No visible rashes, scars.    Lab results: Basic Metabolic Panel: Recent Labs  Lab 02/13/2017 2129 02/19/17 0330  NA 133* 136  K 4.3 4.2  CL 97* 101  CO2 24 23  GLUCOSE 136* 123*  BUN 70* 68*  CREATININE 3.51* 3.61*  CALCIUM 9.0 8.8*  MG  --  1.7  PHOS  --  4.4    Liver Function Tests: No results for input(s): AST, ALT, ALKPHOS, BILITOT, PROT, ALBUMIN in the last 168 hours. No results for input(s): LIPASE, AMYLASE in the last 168 hours. No results for input(s): AMMONIA in the last 168 hours.  CBC: Recent Labs  Lab 02/19/2017 2129 02/19/17 0330  WBC 7.3 7.8  HGB 9.9* 9.0*  HCT 29.6* 26.3*  MCV 86.7 85.9  PLT 478* 424    Cardiac Enzymes: Recent Labs  Lab 02/23/2017 2129  TROPONINI <0.03    BNP: Invalid input(s): POCBNP  CBG: Recent Labs  Lab  02/19/17 0211  GLUCAP 50*    Microbiology: Results for orders placed or performed during the hospital encounter of 02/20/2017  Blood culture (routine x 2)     Status: None (Preliminary result)   Collection Time: 02/15/2017  9:29 PM  Result Value Ref Range Status   Specimen Description BLOOD BLOOD RIGHT FOREARM  Final   Special Requests   Final    BOTTLES DRAWN AEROBIC AND ANAEROBIC Blood Culture adequate volume   Culture   Final    NO GROWTH < 12 HOURS Performed at [facility], [address] [place], [place] [address]    Report Status PENDING  Incomplete  MRSA PCR Screening     Status: None   Collection Time: 02/19/17  2:13 AM  Result Value Ref Range Status   MRSA by PCR NEGATIVE NEGATIVE Final    Comment:        The GeneXpert MRSA Assay (FDA approved for NASAL specimens only), is one component of a comprehensive MRSA colonization surveillance program. It is not intended to diagnose MRSA infection nor to guide or monitor treatment for MRSA infections. Performed at [facility], [address] [place], [place] [address]   Blood culture (routine x 2)     Status: None (Preliminary result)   Collection Time: 02/19/17  3:30 AM  Result Value Ref Range Status   Specimen Description BLOOD RIGHT ANTECUBITAL  Final   Special Requests   Final    BOTTLES DRAWN AEROBIC AND ANAEROBIC Blood Culture adequate volume   Culture   Final    NO GROWTH < 12 HOURS Performed at [facility], [address] [place], [place] [address]    Report Status PENDING  Incomplete    Coagulation Studies: No results for input(s): LABPROT, INR in the last 72 hours.  Urinalysis: No results for input(s): COLORURINE, LABSPEC, PHURINE, GLUCOSEU, HGBUR, BILIRUBINUR, KETONESUR, PROTEINUR, UROBILINOGEN, NITRITE, LEUKOCYTESUR in the last 72 hours.  Invalid input(s): APPERANCEUR    Imaging: Ct Chest Wo Contrast  Result Date: 02/23/2017 CLINICAL DATA:  82 year old female  with shortness of Breath. Diagnosed with pneumonia yesterday. EXAM: CT CHEST WITHOUT CONTRAST TECHNIQUE: Multidetector CT imaging of the chest was performed following the standard protocol without IV contrast. COMPARISON:  CT Abdomen and Pelvis 08/05/2015.  Chest CT 04/28/2012. FINDINGS: Cardiovascular: Calcified aortic atherosclerosis. Calcified coronary artery atherosclerosis. Vascular patency is not evaluated in the absence of IV contrast. There is a small pericardial effusion which is new since 2017 (series 2, image 98). Stable mild cardiomegaly. Mediastinum/Nodes: Mildly increased, reactive appearing mediastinal lymph nodes. Lungs/Pleura: Small bilateral layering pleural effusions are new since 2017. Diffuse bilateral confluent abnormal pulmonary ground-glass opacity in all lobes, relatively sparing the lung apices and the anterior basal segment of the left lower lobe. In some areas the peribronchial opacity is more solid. No overt consolidation at  this time. Major airways remain patent. Upper Abdomen: Surgically absent gallbladder. Negative visible noncontrast liver, spleen, pancreas and right kidney. Surgically absent left kidney. Calcified aortic atherosclerosis. Musculoskeletal: No acute osseous abnormality identified. IMPRESSION: 1. Diffuse Bilateral mostly ground-glass pulmonary opacity compatible with Acute Viral/Atypical Pneumonia. Top differential considerations would include Mycoplasma, Influenza. 2. Associated small bilateral layering Pleural Effusions, and also a small Pericardial Effusion. Mild cardiomegaly is stable. 3. Calcified coronary artery and Aortic Atherosclerosis (ICD10-I70.0). Electronically Signed   By: Genevie Ann M.D.   On: 02/28/2017 22:31      Assessment & Plan: Pt is a 82 y.o. female with a PMHx of renal cell carcinoma status post left nephrectomy and partial right nephrectomy, hypertension, hyperlipidemia, anemia of chronic kidney disease, secondary hyperparathyroidism,  osteoporosis, history of breast cancer, who was admitted to [facility] on 02/12/2017 for evaluation of significant shortness of breath.    1.  Acute renal failure due to hypontension, dehydration. 2.  CKD stage IV baseline Cr 3.0, CKD secondary to left nephrectomy and partial right nephrectomy. 3.  Anemia of CKD.   4.  Secondary hyperparathyroidism. 5.  Bacterial pneumonia.  Plan:  We are asked to see the patient for evaluation management of acute renal failure in the setting of known chronic kidney disease stage IV.  The patient has chronic kidney disease stage IV as a result of left nephrectomy as well as partial right nephrectomy.  As above her baseline creatinine appears to be 3.0.  Her current creatinine is 3.6.  We will restart the patient on IV fluid hydration with 0.9 normal saline at 50 cc/h.  We will also check renal ultrasound, SPEP, UPEP, ANA, urine protein to creatinine ratio, ANCA antibodies, GBM antibodies, C3, and C4.  No acute indication for dialysis at the moment however if renal function does worsen over the course of hospitalization this may need to be considered.  However overall she is high risk for progression to end-stage renal disease given the fact that she only has a partial right kidney.  Avoid nephrotoxins as possible.

## 2017-02-20 ENCOUNTER — Inpatient Hospital Stay: Payer: Medicare Other

## 2017-02-20 LAB — RENAL FUNCTION PANEL
ALBUMIN: 2.1 g/dL — AB (ref 3.5–5.0)
Anion gap: 12 (ref 5–15)
BUN: 82 mg/dL — ABNORMAL HIGH (ref 6–20)
CHLORIDE: 105 mmol/L (ref 101–111)
CO2: 21 mmol/L — ABNORMAL LOW (ref 22–32)
Calcium: 8.4 mg/dL — ABNORMAL LOW (ref 8.9–10.3)
Creatinine, Ser: 3.54 mg/dL — ABNORMAL HIGH (ref 0.44–1.00)
GFR calc Af Amer: 13 mL/min — ABNORMAL LOW (ref >60)
GFR, EST NON AFRICAN AMERICAN: 11 mL/min — AB (ref >60)
Glucose, Bld: 181 mg/dL — ABNORMAL HIGH (ref 65–99)
Phosphorus: 5.7 mg/dL — ABNORMAL HIGH (ref 2.5–4.6)
Potassium: 4.8 mmol/L (ref 3.5–5.1)
Sodium: 138 mmol/L (ref 135–145)

## 2017-02-20 LAB — PARATHYROID HORMONE, INTACT (NO CA): PTH: 31 pg/mL (ref 15–65)

## 2017-02-20 LAB — STREP PNEUMONIAE URINARY ANTIGEN: STREP PNEUMO URINARY ANTIGEN: NEGATIVE

## 2017-02-20 LAB — C4 COMPLEMENT: Complement C4, Body Fluid: 28 mg/dL (ref 14–44)

## 2017-02-20 LAB — MPO/PR-3 (ANCA) ANTIBODIES
ANCA Proteinase 3: <3.5 U/mL (ref 0.0–3.5)
Myeloperoxidase Abs: <9 U/mL (ref 0.0–9.0)

## 2017-02-20 LAB — GLUCOSE, CAPILLARY: GLUCOSE-CAPILLARY: 181 mg/dL — AB (ref 65–99)

## 2017-02-20 LAB — LEGIONELLA PNEUMOPHILA SEROGP 1 UR AG: L. pneumophila Serogp 1 Ur Ag: NEGATIVE

## 2017-02-20 LAB — ANA W/REFLEX IF POSITIVE: Anti Nuclear Antibody(ANA): NEGATIVE

## 2017-02-20 LAB — C3 COMPLEMENT: C3 COMPLEMENT: 173 mg/dL — AB (ref 82–167)

## 2017-02-20 MED ORDER — ALPRAZOLAM 0.25 MG PO TABS
0.2500 mg | ORAL_TABLET | Freq: Two times a day (BID) | ORAL | Status: DC | PRN
  Administered 2017-02-20 – 2017-02-25 (×4): 0.25 mg via ORAL
  Filled 2017-02-20 (×6): qty 1

## 2017-02-20 MED ORDER — METHYLPREDNISOLONE SODIUM SUCC 40 MG IJ SOLR
40.0000 mg | Freq: Four times a day (QID) | INTRAMUSCULAR | Status: DC
  Administered 2017-02-20 – 2017-02-21 (×6): 40 mg via INTRAVENOUS
  Filled 2017-02-20 (×6): qty 1

## 2017-02-20 MED ORDER — OSELTAMIVIR PHOSPHATE 30 MG PO CAPS
30.0000 mg | ORAL_CAPSULE | Freq: Every day | ORAL | Status: DC
  Administered 2017-02-20 – 2017-02-21 (×2): 30 mg via ORAL
  Filled 2017-02-20 (×4): qty 1

## 2017-02-20 NOTE — Progress Notes (Signed)
Chest Pt and IS done earlier in shift. Pt unable to sleep earlier part of night, asked for prn med and given Trazadone with good effect. Sometime while sleeping pt knocked 02 cannula out of nares, sats dropped to 70s, alarm sounded, RN responded and replaced 02 cannula, but RR up to 32  and it took her a while to recover, RN had to wake pt up to have her take slow deep breathes to improve 02 sats.

## 2017-02-20 NOTE — Progress Notes (Signed)
[facility]  ROUNDING NOTE   Subjective:  Patient seen at bedside. Her renal function appears to be stable at the moment but still above her baseline. Urine output documented yesterday was 700 mL.   Objective:  Vital signs in last 24 hours:  Temp:  [97.4 F (36.3 C)-98.7 F (37.1 C)] 98.1 F (36.7 C) (02/16 0800) Pulse Rate:  [59-94] 77 (02/16 1000) Resp:  [19-40] 21 (02/16 1000) BP: (106-145)/(51-99) 128/65 (02/16 1000) SpO2:  [80 %-96 %] 94 % (02/16 1000) FiO2 (%):  [44 %] 44 % (02/15 1318) Weight:  [54.9 kg (121 lb 0.5 oz)] 54.9 kg (121 lb 0.5 oz) (02/16 0500)  Weight change: 2.283 kg (5 lb 0.5 oz) Filed Weights   02/21/2017 2114 02/20/17 0500  Weight: 52.6 kg (116 lb) 54.9 kg (121 lb 0.5 oz)    Intake/Output: I/O last 3 completed shifts: In: 1205 [P.O.:350; I.V.:755; IV Piggyback:100] Out: 900 [Urine:900]   Intake/Output this shift:  Total I/O In: 100 [I.V.:100] Out: 350 [Urine:350]  Physical Exam: General: No acute distress  Head: Normocephalic, atraumatic. Moist oral mucosal membranes  Eyes: Anicteric  Neck: Supple, trachea midline  Lungs:  Scattered rhonchi, normal effort  Heart: S1S2 no rubs  Abdomen:  Soft, nontender, bowel sounds present  Extremities: No peripheral edema.  Neurologic: Awake, alert, following commands  Skin: No lesions       Basic Metabolic Panel: Recent Labs  Lab 02/25/2017 2129 02/19/17 0330 02/19/17 1218 02/20/17 0541  NA 133* 136  --  138  K 4.3 4.2  --  4.8  CL 97* 101  --  105  CO2 24 23  --  21*  GLUCOSE 136* 123*  --  181*  BUN 70* 68*  --  82*  CREATININE 3.51* 3.61*  --  3.54*  CALCIUM 9.0 8.8*  --  8.4*  MG  --  1.7  --   --   PHOS  --  4.4 5.4* 5.7*    Liver Function Tests: Recent Labs  Lab 02/20/17 0541  ALBUMIN 2.1*   No results for input(s): LIPASE, AMYLASE in the last 168 hours. No results for input(s): AMMONIA in the last 168 hours.  CBC: Recent Labs  Lab 02/17/2017 2129  02/19/17 0330  WBC 7.3 7.8  HGB 9.9* 9.0*  HCT 29.6* 26.3*  MCV 86.7 85.9  PLT 478* 424    Cardiac Enzymes: Recent Labs  Lab 02/19/2017 2129  TROPONINI <0.03    BNP: Invalid input(s): POCBNP  CBG: Recent Labs  Lab 02/19/17 0211 02/20/17 0734  GLUCAP 124* 181*    Microbiology: Results for orders placed or performed during the hospital encounter of 02/16/2017  Blood culture (routine x 2)     Status: None (Preliminary result)   Collection Time: 02/28/2017  9:29 PM  Result Value Ref Range Status   Specimen Description BLOOD BLOOD RIGHT FOREARM  Final   Special Requests   Final    BOTTLES DRAWN AEROBIC AND ANAEROBIC Blood Culture adequate volume   Culture   Final    NO GROWTH 2 DAYS Performed at [facility], [address], [place], [place] [address]    Report Status PENDING  Incomplete  MRSA PCR Screening     Status: None   Collection Time: 02/19/17  2:13 AM  Result Value Ref Range Status   MRSA by PCR NEGATIVE NEGATIVE Final    Comment:        The GeneXpert MRSA Assay (FDA approved for NASAL specimens  only), is one component of a comprehensive MRSA colonization surveillance program. It is not intended to diagnose MRSA infection nor to guide or monitor treatment for MRSA infections. Performed at [facility], [address] [place], [place] [address]   Blood culture (routine x 2)     Status: None (Preliminary result)   Collection Time: 02/19/17  3:30 AM  Result Value Ref Range Status   Specimen Description BLOOD RIGHT ANTECUBITAL  Final   Special Requests   Final    BOTTLES DRAWN AEROBIC AND ANAEROBIC Blood Culture adequate volume   Culture   Final    NO GROWTH 1 DAY Performed at [facility], [address] [place], [place] [address]    Report Status PENDING  Incomplete    Coagulation Studies: No results for input(s): LABPROT, INR in the last 72 hours.  Urinalysis: No results for input(s): COLORURINE, LABSPEC, PHURINE,  GLUCOSEU, HGBUR, BILIRUBINUR, KETONESUR, PROTEINUR, UROBILINOGEN, NITRITE, LEUKOCYTESUR in the last 72 hours.  Invalid input(s): APPERANCEUR    Imaging: Ct Chest Wo Contrast  Result Date: 02/19/2017 CLINICAL DATA:  82 year old female with shortness of Breath. Diagnosed with pneumonia yesterday. EXAM: CT CHEST WITHOUT CONTRAST TECHNIQUE: Multidetector CT imaging of the chest was performed following the standard protocol without IV contrast. COMPARISON:  CT Abdomen and Pelvis 08/05/2015.  Chest CT 04/28/2012. FINDINGS: Cardiovascular: Calcified aortic atherosclerosis. Calcified coronary artery atherosclerosis. Vascular patency is not evaluated in the absence of IV contrast. There is a small pericardial effusion which is new since 2017 (series 2, image 98). Stable mild cardiomegaly. Mediastinum/Nodes: Mildly increased, reactive appearing mediastinal lymph nodes. Lungs/Pleura: Small bilateral layering pleural effusions are new since 2017. Diffuse bilateral confluent abnormal pulmonary ground-glass opacity in all lobes, relatively sparing the lung apices and the anterior basal segment of the left lower lobe. In some areas the peribronchial opacity is more solid. No overt consolidation at this time. Major airways remain patent. Upper Abdomen: Surgically absent gallbladder. Negative visible noncontrast liver, spleen, pancreas and right kidney. Surgically absent left kidney. Calcified aortic atherosclerosis. Musculoskeletal: No acute osseous abnormality identified. IMPRESSION: 1. Diffuse Bilateral mostly ground-glass pulmonary opacity compatible with Acute Viral/Atypical Pneumonia. Top differential considerations would include Mycoplasma, Influenza. 2. Associated small bilateral layering Pleural Effusions, and also a small Pericardial Effusion. Mild cardiomegaly is stable. 3. Calcified coronary artery and Aortic Atherosclerosis (ICD10-I70.0). Electronically Signed   By: Genevie Ann M.D.   On: 03/03/2017 22:31   US  Renal  Result Date: 02/19/2017 CLINICAL DATA:  Acute renal failure. Previous left nephrectomy for renal cell carcinoma. EXAM: RENAL / URINARY TRACT ULTRASOUND COMPLETE COMPARISON:  None. FINDINGS: Right Kidney: Length: 9.6 cm. Echogenicity within normal limits. A well-circumscribed hypoechoic lesion is seen in the midpole of the right kidney which measures 1.9 cm. This could represent a complex cyst or solid mass. No hydronephrosis visualized. Left Kidney: Length: Surgically absent. Bladder: Appears normal for degree of bladder distention. IMPRESSION: No evidence of hydronephrosis. 1.9 cm hypoechoic lesion in right kidney, which could represent a complex cyst or solid neoplasm. Recommend abdomen MRI without contrast for further characterization. Previous left nephrectomy. Electronically Signed   By: Earle Gell M.D.   On: 02/19/2017 15:16     Medications:   . sodium chloride 50 mL/hr at 02/20/17 0700  . cefTRIAXone (ROCEPHIN)  IV Stopped (02/20/17 1018)   . anastrozole  1 mg Oral Daily  . azithromycin  500 mg Oral q1800  . calcium acetate  667 mg Oral TID WC  . docusate  sodium  100 mg Oral BID  . ferrous sulfate  325 mg Oral Q breakfast  . heparin  5,000 Units Subcutaneous Q8H  . ipratropium-albuterol  3 mL Nebulization Q6H  . levothyroxine  88 mcg Oral QAC breakfast  . methylPREDNISolone (SOLU-MEDROL) injection  40 mg Intravenous Q6H  . metoprolol  200 mg Oral Daily  . PARoxetine  10 mg Oral Daily  . pravastatin  20 mg Oral Daily   acetaminophen **OR** acetaminophen, bisacodyl, HYDROcodone-acetaminophen, ondansetron **OR** ondansetron (ZOFRAN) IV, traZODone  Assessment/ Plan:  82 y.o. female with a PMHx of renal cell carcinoma status post left nephrectomy and partial right nephrectomy, hypertension, hyperlipidemia, anemia of chronic kidney disease, secondary hyperparathyroidism, osteoporosis, history of breast cancer, who was admitted to [facility] on 02/14/2017 for evaluation of significant  shortness of breath.    1.  Acute renal failure due to hypontension, dehydration. 2.  CKD stage IV baseline Cr 3.0, CKD secondary to left nephrectomy and partial right nephrectomy. 3.  Anemia of CKD.   4.  Secondary hyperparathyroidism. 5.  Bacterial pneumonia. 6.  Right renal mass.   Plan:  The patient's renal function appears to be stable at the moment.  However creatinine remains above baseline.  Urine output was 700 mL over the preceding 24 hours.  No acute indication for dialysis at the moment.  However we do note a right renal mass.  She has history of renal cell carcinoma in the left kidney.  When more stable we would like to obtain MRI of the abdomen and pelvis to more fully characterize this growth in the right kidney.  Further plan as patient progresses.     LOS: 1 Erin Mccormick 2/16/201911:28 AM

## 2017-02-20 NOTE — Progress Notes (Signed)
Patient ID: Erin Mccormick, female   DOB: May 24, 1935, 82 y.o.   MRN: [id number]  [employer] [facility] PROGRESS NOTE  Erin Mccormick OHY:[id number] DOB: 1935-09-13 DOA: 02/14/2017 PCP: Dion Body, MD  HPI/Subjective: Patient feeling better than yesterday.  Some cough and shortness of breath.  She states she does not wear oxygen at home.  Objective: Vitals:   02/20/17 1200 02/20/17 1201  BP: 121/84 121/84  Pulse: 71 73  Resp: (!) 30 (!) 26  Temp:  97.6 F (36.4 C)  SpO2: 91% 90%    Filed Weights   02/14/2017 2114 02/20/17 0500  Weight: 52.6 kg (116 lb) 54.9 kg (121 lb 0.5 oz)    ROS: Review of Systems  Constitutional: Negative for chills and fever.  Eyes: Negative for blurred vision.  Respiratory: Positive for cough and shortness of breath.   Cardiovascular: Negative for chest pain.  Gastrointestinal: Negative for abdominal pain, constipation, diarrhea, nausea and vomiting.  Genitourinary: Negative for dysuria.  Musculoskeletal: Negative for joint pain.  Neurological: Negative for dizziness and headaches.   Exam: Physical Exam  Constitutional: She is oriented to person, place, and time.  HENT:  Nose: No mucosal edema.  Mouth/Throat: No oropharyngeal exudate or posterior oropharyngeal edema.  Eyes: Conjunctivae, EOM and lids are normal. Pupils are equal, round, and reactive to light.  Neck: No JVD present. Carotid bruit is not present. No edema present. No thyroid mass and no thyromegaly present.  Cardiovascular: S1 normal and S2 normal. Exam reveals no gallop.  No murmur heard. Pulses:      Dorsalis pedis pulses are 2+ on the right side, and 2+ on the left side.  Respiratory: No respiratory distress. She has decreased breath sounds in the right middle field, the right lower field, the left middle field and the left lower field. She has no wheezes. She has no rhonchi. She has no rales.  GI: Soft. Bowel sounds are normal. There is no tenderness.  Musculoskeletal:        Right ankle: She exhibits no swelling.       Left ankle: She exhibits no swelling.  Lymphadenopathy:    She has no cervical adenopathy.  Neurological: She is alert and oriented to person, place, and time. No cranial nerve deficit.  Skin: Skin is warm. No rash noted. Nails show no clubbing.  Psychiatric: She has a normal mood and affect.      Data Reviewed: Basic Metabolic Panel: Recent Labs  Lab 02/21/2017 2129 02/19/17 0330 02/19/17 1218 02/20/17 0541  NA 133* 136  --  138  K 4.3 4.2  --  4.8  CL 97* 101  --  105  CO2 24 23  --  21*  GLUCOSE 136* 123*  --  181*  BUN 70* 68*  --  82*  CREATININE 3.51* 3.61*  --  3.54*  CALCIUM 9.0 8.8*  --  8.4*  MG  --  1.7  --   --   PHOS  --  4.4 5.4* 5.7*   Liver Function Tests: Recent Labs  Lab 02/20/17 0541  ALBUMIN 2.1*   CBC: Recent Labs  Lab 02/14/2017 2129 02/19/17 0330  WBC 7.3 7.8  HGB 9.9* 9.0*  HCT 29.6* 26.3*  MCV 86.7 85.9  PLT 478* 424   Cardiac Enzymes: Recent Labs  Lab 02/24/2017 2129  TROPONINI <0.03    CBG: Recent Labs  Lab 02/19/17 0211 02/20/17 0734  GLUCAP 124* 181*    Recent Results (from the past 240 hour(s))  Blood  culture (routine x 2)     Status: None (Preliminary result)   Collection Time: 02/12/2017  9:29 PM  Result Value Ref Range Status   Specimen Description BLOOD BLOOD RIGHT FOREARM  Final   Special Requests   Final    BOTTLES DRAWN AEROBIC AND ANAEROBIC Blood Culture adequate volume   Culture   Final    NO GROWTH 2 DAYS Performed at [facility], [address], [place], [place] [address]    Report Status PENDING  Incomplete  MRSA PCR Screening     Status: None   Collection Time: 02/19/17  2:13 AM  Result Value Ref Range Status   MRSA by PCR NEGATIVE NEGATIVE Final    Comment:        The GeneXpert MRSA Assay (FDA approved for NASAL specimens only), is one component of a comprehensive MRSA colonization surveillance program. It is not intended to diagnose  MRSA infection nor to guide or monitor treatment for MRSA infections. Performed at [facility], [address] [place], [place] [address]   Blood culture (routine x 2)     Status: None (Preliminary result)   Collection Time: 02/19/17  3:30 AM  Result Value Ref Range Status   Specimen Description BLOOD RIGHT ANTECUBITAL  Final   Special Requests   Final    BOTTLES DRAWN AEROBIC AND ANAEROBIC Blood Culture adequate volume   Culture   Final    NO GROWTH 1 DAY Performed at [facility], [address], [place], [place] [address]    Report Status PENDING  Incomplete     Studies: Ct Chest Wo Contrast  Result Date: 02/09/2017 CLINICAL DATA:  82 year old female with shortness of Breath. Diagnosed with pneumonia yesterday. EXAM: CT CHEST WITHOUT CONTRAST TECHNIQUE: Multidetector CT imaging of the chest was performed following the standard protocol without IV contrast. COMPARISON:  CT Abdomen and Pelvis 08/05/2015.  Chest CT 04/28/2012. FINDINGS: Cardiovascular: Calcified aortic atherosclerosis. Calcified coronary artery atherosclerosis. Vascular patency is not evaluated in the absence of IV contrast. There is a small pericardial effusion which is new since 2017 (series 2, image 98). Stable mild cardiomegaly. Mediastinum/Nodes: Mildly increased, reactive appearing mediastinal lymph nodes. Lungs/Pleura: Small bilateral layering pleural effusions are new since 2017. Diffuse bilateral confluent abnormal pulmonary ground-glass opacity in all lobes, relatively sparing the lung apices and the anterior basal segment of the left lower lobe. In some areas the peribronchial opacity is more solid. No overt consolidation at this time. Major airways remain patent. Upper Abdomen: Surgically absent gallbladder. Negative visible noncontrast liver, spleen, pancreas and right kidney. Surgically absent left kidney. Calcified aortic atherosclerosis. Musculoskeletal: No acute osseous abnormality  identified. IMPRESSION: 1. Diffuse Bilateral mostly ground-glass pulmonary opacity compatible with Acute Viral/Atypical Pneumonia. Top differential considerations would include Mycoplasma, Influenza. 2. Associated small bilateral layering Pleural Effusions, and also a small Pericardial Effusion. Mild cardiomegaly is stable. 3. Calcified coronary artery and Aortic Atherosclerosis (ICD10-I70.0). Electronically Signed   By: Genevie Ann M.D.   On: 02/23/2017 22:31   US Renal  Result Date: 02/19/2017 CLINICAL DATA:  Acute renal failure. Previous left nephrectomy for renal cell carcinoma. EXAM: RENAL / URINARY TRACT ULTRASOUND COMPLETE COMPARISON:  None. FINDINGS: Right Kidney: Length: 9.6 cm. Echogenicity within normal limits. A well-circumscribed hypoechoic lesion is seen in the midpole of the right kidney which measures 1.9 cm. This could represent a complex cyst or solid mass. No hydronephrosis visualized. Left Kidney: Length: Surgically absent. Bladder: Appears normal for degree of bladder distention. IMPRESSION:  No evidence of hydronephrosis. 1.9 cm hypoechoic lesion in right kidney, which could represent a complex cyst or solid neoplasm. Recommend abdomen MRI without contrast for further characterization. Previous left nephrectomy. Electronically Signed   By: Earle Gell M.D.   On: 02/19/2017 15:16    Scheduled Meds: . anastrozole  1 mg Oral Daily  . azithromycin  500 mg Oral q1800  . calcium acetate  667 mg Oral TID WC  . docusate sodium  100 mg Oral BID  . ferrous sulfate  325 mg Oral Q breakfast  . heparin  5,000 Units Subcutaneous Q8H  . ipratropium-albuterol  3 mL Nebulization Q6H  . levothyroxine  88 mcg Oral QAC breakfast  . methylPREDNISolone (SOLU-MEDROL) injection  40 mg Intravenous Q6H  . metoprolol  200 mg Oral Daily  . PARoxetine  10 mg Oral Daily  . pravastatin  20 mg Oral Daily   Continuous Infusions: . sodium chloride 50 mL/hr at 02/20/17 0700  . cefTRIAXone (ROCEPHIN)  IV Stopped  (02/20/17 1018)    Assessment/Plan:  1. Acute hypoxic respiratory failure.  Patient still on quite a bit of oxygen via nasal cannula.  Off BiPAP at this time. 2. Bilateral pneumonia.  Patient on Rocephin and Zithromax and Solu-Medrol 3. Acute kidney disease on chronic kidney disease stage 4.  Baseline creatinine around 3.  Patient has history of renal cell carcinoma with left nephrectomy.  She has a history of partial right nephrectomy in which the patient states that this was negative for cancer. 4. Right renal mass.  Nephrology recommended an MRI. 5. Hypothyroidism unspecified on levothyroxine 6. Anxiety depression on Paxil 7. Essential hypertension on metoprolol 8. Hyperlipidemia unspecified on pravastatin 9. History of breast cancer on anastrozole  Code Status:     Code Status Orders  (From admission, onward)        Start     Ordered   02/19/17 0226  Full code  Continuous     02/19/17 0225    Code Status History    Date Active Date Inactive Code Status Order ID Comments User Context   07/01/2016 08:53 07/02/2016 14:29 Full Code [id number]  Saundra Shelling, MD Inpatient    Advance Directive Documentation     Most Recent Value  Type of Advance Directive  Living will  Pre-existing out of facility DNR order (yellow form or pink MOST form)  No data  "MOST" Form in Place?  No data     Family Communication: As per critical care team Disposition Plan: To be determined  Consultants:  Critical care specialist  Nephrology  Antibiotics:  Rocephin  Zithromax  Time spent: 27 minutes  [place]

## 2017-02-20 NOTE — Progress Notes (Signed)
Patient found to have right renal mass, discussed with Dr. Celesta Aver.  Will plan for MRI abd to evaluate this.  Patient is stable for MRI.

## 2017-02-20 NOTE — Progress Notes (Signed)
Patient still very short of breath with exertion. Os saturations drop to upper 70s and low 80s when up to [facility], but patient recovers quickly when activity stops. Alert and oriented, no complaints of pain. Patient states that she feels very slightly short of breath even when resting. O2 saturations maintained with rest on 6 L nasal cannula.

## 2017-02-20 NOTE — Progress Notes (Signed)
PULMONARY / CRITICAL CARE MEDICINE   Name: Erin Mccormick MRN: [id number] DOB: May 24, 1935    ADMISSION DATE:  02/15/2017   CONSULTATION DATE:  02/19/2017  REFERRING MD: Dr. Duane Boston  Reason: Acute hypoxic respiratory failure  HISTORY OF PRESENT ILLNESS:   This is a 82 year old Caucasian female with a history of renal cell carcinoma status post nephrectomy, hypertension, hyperlipidemia, CKD, depression and anemia who presented to the ED with progressive shortness of breath times 5 days.  Patient states that symptoms started on Sunday with weakness and progressed to shortness of breath yesterday hence she went to her primary care provider and was diagnosed with pneumonia and started on antibiotics.  She went home and today she became acutely short of breath.  Upon arrival in the ED, patient's SPO2 was 68% on room air.  She is not on home O2 and denies any tobacco use.  She denies any history of congestive heart failure or major heart problems.  Shortness of breath is associated with chest weakness and no other symptoms.  At the ED, [place] chest CT showed diffuse inflammatory changes bilateral pleural effusions and small pericardial effusion.  He is being admitted to the ICU for further management  SUBJECTIVE: no acute issues overnight. Off BiPAP and doing well VITAL SIGNS: BP (!) 106/51   Pulse (!) 59   Temp 98.1 F (36.7 C) (Oral)   Resp (!) 33   Ht 5\' 5"  (1.651 m)   Wt 121 lb 0.5 oz (54.9 kg)   SpO2 95%   BMI 20.14 kg/m   HEMODYNAMICS:    VENTILATOR SETTINGS: FiO2 (%):  [44 %] 44 %  INTAKE / OUTPUT: I/O last 3 completed shifts: In: 1205 [P.O.:350; I.V.:755; IV Piggyback:100] Out: 900 [Urine:900]  PHYSICAL EXAMINATION: General: Chronically ill looking, no acute distress Neuro: Alert and oriented x4, cranial nerves intact HEENT: PERRLA, trachea midline, no JVD Cardiovascular:  RRR, S1-S2, no murmur regurg or gallop, +2 pulses, no edema Lungs: Bilateral breath sounds with diffuse  rhonchi in all lung fields right greater than left, breath sounds diminished in the bases, positive Rales bilaterally Abdomen: Abdomen is soft, nontender, nondistended, normal bowel sounds, positive aortic bruit Musculoskeletal: Positive range of motion in upper and lower extremities no deformities Skin: Warm and dry  LABS:  BMET Recent Labs  Lab 02/15/2017 2129 02/19/17 0330 02/20/17 0541  NA 133* 136 138  K 4.3 4.2 4.8  CL 97* 101 105  CO2 24 23 21*  BUN 70* 68* 82*  CREATININE 3.51* 3.61* 3.54*  GLUCOSE 136* 123* 181*    Electrolytes Recent Labs  Lab 03/04/2017 2129 02/19/17 0330 02/19/17 1218 02/20/17 0541  CALCIUM 9.0 8.8*  --  8.4*  MG  --  1.7  --   --   PHOS  --  4.4 5.4* 5.7*    CBC Recent Labs  Lab 02/14/2017 2129 02/19/17 0330  WBC 7.3 7.8  HGB 9.9* 9.0*  HCT 29.6* 26.3*  PLT 478* 424    Coag's No results for input(s): APTT, INR in the last 168 hours.  Sepsis Markers Recent Labs  Lab 03/03/2017 2129 02/19/17 0330  LATICACIDVEN 1.1 0.7    ABG No results for input(s): PHART, PCO2ART, PO2ART in the last 168 hours.  Liver Enzymes Recent Labs  Lab 02/20/17 0541  ALBUMIN 2.1*    Cardiac Enzymes Recent Labs  Lab 02/20/2017 2129  TROPONINI <0.03    Glucose Recent Labs  Lab 02/19/17 0211 02/20/17 0734  GLUCAP 124* 181*  Imaging US Renal  Result Date: 02/19/2017 CLINICAL DATA:  Acute renal failure. Previous left nephrectomy for renal cell carcinoma. EXAM: RENAL / URINARY TRACT ULTRASOUND COMPLETE COMPARISON:  None. FINDINGS: Right Kidney: Length: 9.6 cm. Echogenicity within normal limits. A well-circumscribed hypoechoic lesion is seen in the midpole of the right kidney which measures 1.9 cm. This could represent a complex cyst or solid mass. No hydronephrosis visualized. Left Kidney: Length: Surgically absent. Bladder: Appears normal for degree of bladder distention. IMPRESSION: No evidence of hydronephrosis. 1.9 cm hypoechoic lesion in  right kidney, which could represent a complex cyst or solid neoplasm. Recommend abdomen MRI without contrast for further characterization. Previous left nephrectomy. Electronically Signed   By: Earle Gell M.D.   On: 02/19/2017 15:16   STUDIES:  2D echo>LV EF: 65% -   70% CULTURES: Blood cultures x2  ANTIBIOTICS: Ceftriaxone Azithromycin  SIGNIFICANT EVENTS: 02/08/2017: Admitted  LINES/TUBES: Peripheral IVs  DISCUSSION: 82 year old female presenting with multifocal pneumonia and diffuse pulmonary fibrosis; exact etiology unclear, and worsening CKD  ASSESSMENT  Acute hypoxic respiratory failure Multifocal pneumonia Diffuse pulmonary fibrosis Worsening CKD History of renal cell carcinoma status post nephrectomy History of hypertension, hyperlipidemia, depression, and gastritis  PLAN Supplemental oxygen as needed to maintain SPO2 greater than 90% IV steroids Nebulized bronchodilators Legionella and strep pneumonia urine antigen Antibiotics as above Nephrology consult Monitor intake and output Continue  all home medications GI and DVT prophylaxis  Patient is stable for transfer out of the ICU  FAMILY  - Updates: Patient updated on current treatment plan.  - Inter-disciplinary family meet or Palliative Care meeting due by:  day 7 Bo Teicher S. [facility] ANP-BC Pulmonary and Critical Care Medicine [facility] Pager [phone] or [phone]  NB: This document was prepared using Dragon voice recognition software and may include unintentional dictation errors.    02/20/2017, 9:00 AM

## 2017-02-21 ENCOUNTER — Inpatient Hospital Stay: Payer: Medicare Other

## 2017-02-21 DIAGNOSIS — D649 Anemia, unspecified: Secondary | ICD-10-CM

## 2017-02-21 DIAGNOSIS — N179 Acute kidney failure, unspecified: Principal | ICD-10-CM

## 2017-02-21 LAB — GLUCOSE, CAPILLARY: GLUCOSE-CAPILLARY: 199 mg/dL — AB (ref 65–99)

## 2017-02-21 LAB — RENAL FUNCTION PANEL
Albumin: 2.3 g/dL — ABNORMAL LOW (ref 3.5–5.0)
Anion gap: 12 (ref 5–15)
BUN: 93 mg/dL — ABNORMAL HIGH (ref 6–20)
CHLORIDE: 105 mmol/L (ref 101–111)
CO2: 20 mmol/L — AB (ref 22–32)
Calcium: 8.4 mg/dL — ABNORMAL LOW (ref 8.9–10.3)
Creatinine, Ser: 3.6 mg/dL — ABNORMAL HIGH (ref 0.44–1.00)
GFR calc non Af Amer: 11 mL/min — ABNORMAL LOW (ref >60)
GFR, EST AFRICAN AMERICAN: 13 mL/min — AB (ref >60)
Glucose, Bld: 204 mg/dL — ABNORMAL HIGH (ref 65–99)
POTASSIUM: 5 mmol/L (ref 3.5–5.1)
Phosphorus: 5.2 mg/dL — ABNORMAL HIGH (ref 2.5–4.6)
Sodium: 137 mmol/L (ref 135–145)

## 2017-02-21 LAB — CBC
HCT: 28.1 % — ABNORMAL LOW (ref 35.0–47.0)
Hemoglobin: 9.4 g/dL — ABNORMAL LOW (ref 12.0–16.0)
MCH: 29 pg (ref 26.0–34.0)
MCHC: 33.4 g/dL (ref 32.0–36.0)
MCV: 86.9 fL (ref 80.0–100.0)
PLATELETS: 558 10*3/uL — AB (ref 150–440)
RBC: 3.23 MIL/uL — ABNORMAL LOW (ref 3.80–5.20)
RDW: 14.3 % (ref 11.5–14.5)
WBC: 11.3 10*3/uL — AB (ref 3.6–11.0)

## 2017-02-21 LAB — MAGNESIUM: MAGNESIUM: 2.1 mg/dL (ref 1.7–2.4)

## 2017-02-21 LAB — PHOSPHORUS: PHOSPHORUS: 4.6 mg/dL (ref 2.5–4.6)

## 2017-02-21 MED ORDER — LIDOCAINE HCL 1 % IJ SOLN
10.0000 mL | Freq: Once | INTRAMUSCULAR | Status: DC
  Filled 2017-02-21: qty 10

## 2017-02-21 MED ORDER — HEPARIN SODIUM (PORCINE) 1000 UNIT/ML DIALYSIS: 1000.0000 [IU] | INTRAMUSCULAR | Status: DC | PRN

## 2017-02-21 MED ORDER — LIDOCAINE-PRILOCAINE 2.5-2.5 % EX CREA
1.0000 "application " | TOPICAL_CREAM | CUTANEOUS | Status: DC | PRN
  Filled 2017-02-21: qty 5

## 2017-02-21 MED ORDER — LIDOCAINE HCL (PF) 1 % IJ SOLN
5.0000 mL | INTRAMUSCULAR | Status: DC | PRN
  Filled 2017-02-21: qty 5

## 2017-02-21 MED ORDER — METHYLPREDNISOLONE SODIUM SUCC 40 MG IJ SOLR
40.0000 mg | Freq: Two times a day (BID) | INTRAMUSCULAR | Status: DC
  Administered 2017-02-22 – 2017-02-23 (×3): 40 mg via INTRAVENOUS
  Filled 2017-02-21 (×3): qty 1

## 2017-02-21 MED ORDER — SODIUM CHLORIDE 0.9 % IV SOLN: 100.0000 mL | INTRAVENOUS | Status: DC | PRN

## 2017-02-21 MED ORDER — PENTAFLUOROPROP-TETRAFLUOROETH EX AERO
1.0000 "application " | INHALATION_SPRAY | CUTANEOUS | Status: DC | PRN
  Filled 2017-02-21: qty 30

## 2017-02-21 MED ORDER — ALTEPLASE 2 MG IJ SOLR: 2.0000 mg | Freq: Once | INTRAMUSCULAR | Status: DC | PRN

## 2017-02-21 NOTE — Procedures (Signed)
Central Venous Dailysis Catheter Placement: DOUBLE LUMEN  Indication: Hemo Dialysis/CRRT   Consent:acquired from patient   Hand washing performed prior to starting the procedure.   Procedure: An active timeout was performed and correct patient, name, & ID confirmed. Patient was positioned correctly for central venous access. Patient was prepped using strict sterile technique including chlorohexadine preps, sterile drape, sterile gown and sterile gloves.  The area was prepped, draped and anesthetized in the usual sterile manner. Patient comfort was obtained.    A Double lumen catheter was placed in Right Femoral Vein There was good blood return, catheter caps were placed on lumens, catheter flushed easily, the line was secured and a sterile dressing and BIO-PATCH applied.   Ultrasound was used to visualize vasculature and guidance of needle.   Number of Attempts: 1 Complications:none  Estimated Blood Loss: none Operator: Jerilynn Birkenhead, M.D

## 2017-02-21 NOTE — Progress Notes (Signed)
[facility]  ROUNDING NOTE   Subjective:  MRI of the abdomen was performed yesterday and did show a 1.8 cm right renal mass. However it could not be fully characterize given the lack of contrast. She would likely need urology follow-up. Renal function remains low with an EGFR of 11. We have recommended initiation of dialysis.  Objective:  Vital signs in last 24 hours:  Temp:  [97.4 F (36.3 C)-97.8 F (36.6 C)] 97.4 F (36.3 C) (02/17 0802) Pulse Rate:  [65-77] 70 (02/17 0945) Resp:  [20-40] 34 (02/17 0945) BP: (118-161)/(55-98) 132/67 (02/17 0900) SpO2:  [85 %-100 %] 90 % (02/17 0945) FiO2 (%):  [44 %-50 %] 50 % (02/17 0800) Weight:  [58.6 kg (129 lb 3 oz)] 58.6 kg (129 lb 3 oz) (02/17 0500)  Weight change: 3.7 kg (8 lb 2.5 oz) Filed Weights   02/19/2017 2114 02/20/17 0500 02/21/17 0500  Weight: 52.6 kg (116 lb) 54.9 kg (121 lb 0.5 oz) 58.6 kg (129 lb 3 oz)    Intake/Output: I/O last 3 completed shifts: In: 2775 [P.O.:720; I.V.:1955; IV Piggyback:100] Out: 750 [Urine:750]   Intake/Output this shift:  Total I/O In: 100 [I.V.:100] Out: -   Physical Exam: General: No acute distress  Head: Normocephalic, atraumatic. Moist oral mucosal membranes  Eyes: Anicteric  Neck: Supple, trachea midline  Lungs:  Scattered rhonchi, normal effort  Heart: S1S2 no rubs  Abdomen:  Soft, nontender, bowel sounds present  Extremities: No peripheral edema.  Neurologic: Awake, alert, following commands  Skin: No lesions       Basic Metabolic Panel: Recent Labs  Lab 02/27/2017 2129 02/19/17 0330 02/19/17 1218 02/20/17 0541 02/21/17 0540  NA 133* 136  --  138 137  K 4.3 4.2  --  4.8 5.0  CL 97* 101  --  105 105  CO2 24 23  --  21* 20*  GLUCOSE 136* 123*  --  181* 204*  BUN 70* 68*  --  82* 93*  CREATININE 3.51* 3.61*  --  3.54* 3.60*  CALCIUM 9.0 8.8*  --  8.4* 8.4*  MG  --  1.7  --   --  2.1  PHOS  --  4.4 5.4* 5.7* 5.2*    Liver Function Tests: Recent Labs   Lab 02/20/17 0541 02/21/17 0540  ALBUMIN 2.1* 2.3*   No results for input(s): LIPASE, AMYLASE in the last 168 hours. No results for input(s): AMMONIA in the last 168 hours.  CBC: Recent Labs  Lab 03/02/2017 2129 02/19/17 0330 02/21/17 0540  WBC 7.3 7.8 11.3*  HGB 9.9* 9.0* 9.4*  HCT 29.6* 26.3* 28.1*  MCV 86.7 85.9 86.9  PLT 478* 424 558*    Cardiac Enzymes: Recent Labs  Lab 02/16/2017 2129  TROPONINI <0.03    BNP: Invalid input(s): POCBNP  CBG: Recent Labs  Lab 02/19/17 0211 02/20/17 0734 02/21/17 0731  GLUCAP 124* 181* 199*    Microbiology: Results for orders placed or performed during the hospital encounter of 03/02/2017  Blood culture (routine x 2)     Status: None (Preliminary result)   Collection Time: 02/20/2017  9:29 PM  Result Value Ref Range Status   Specimen Description BLOOD BLOOD RIGHT FOREARM  Final   Special Requests   Final    BOTTLES DRAWN AEROBIC AND ANAEROBIC Blood Culture adequate volume   Culture   Final    NO GROWTH 3 DAYS Performed at [facility], [address] [place], [place] [address]    Report  Status PENDING  Incomplete  MRSA PCR Screening     Status: None   Collection Time: 02/19/17  2:13 AM  Result Value Ref Range Status   MRSA by PCR NEGATIVE NEGATIVE Final    Comment:        The GeneXpert MRSA Assay (FDA approved for NASAL specimens only), is one component of a comprehensive MRSA colonization surveillance program. It is not intended to diagnose MRSA infection nor to guide or monitor treatment for MRSA infections. Performed at [facility], [address] [place], [place] [address]   Blood culture (routine x 2)     Status: None (Preliminary result)   Collection Time: 02/19/17  3:30 AM  Result Value Ref Range Status   Specimen Description BLOOD RIGHT ANTECUBITAL  Final   Special Requests   Final    BOTTLES DRAWN AEROBIC AND ANAEROBIC Blood Culture adequate volume   Culture   Final    NO GROWTH 2  DAYS Performed at [facility], [address], [place], [place] [address]    Report Status PENDING  Incomplete    Coagulation Studies: No results for input(s): LABPROT, INR in the last 72 hours.  Urinalysis: No results for input(s): COLORURINE, LABSPEC, PHURINE, GLUCOSEU, HGBUR, BILIRUBINUR, KETONESUR, PROTEINUR, UROBILINOGEN, NITRITE, LEUKOCYTESUR in the last 72 hours.  Invalid input(s): APPERANCEUR    Imaging: Mr Abdomen Wo Contrast  Result Date: 02/20/2017 CLINICAL DATA:  History of renal cell carcinoma status post left nephrectomy and partial right nephrectomy. Abdominal mass. EXAM: MRI ABDOMEN WITHOUT CONTRAST TECHNIQUE: Multiplanar multisequence MR imaging was performed without the administration of intravenous contrast. COMPARISON:  Ultrasound 02/19/2017 and CT abdomen pelvis 08/05/2015 FINDINGS: Significantly diminished exam detail due to motion artifact. Exam detail also diminished due to lack of IV contrast material. Lower chest: Moderate bilateral pleural effusions identified. Hepatobiliary: No focal liver abnormality identified. Previous cholecystectomy. No biliary dilatation. Pancreas: No mass, inflammatory changes, or other parenchymal abnormality identified. Spleen:  Within normal limits in size and appearance. Adrenals/Urinary Tract: No adrenal mass visualized. Status post left nephrectomy. Arising from the upper pole of right kidney is a T2 hyperintense and T1 isointense structure measuring 9 mm, image 19 of series 14. Several additional small T2 hyperintense cortical based lesions noted. Arising from the lateral cortex of the interpolar right kidney is a focal contour deformity corresponding to the suspected lesion identified on recent ultrasound. This measures approximate 1.8 cm and is incompletely characterized without IV contrast. This lesion appears isointense on the T1 weighted images and isointense on the T2 weighted images. Stomach/Bowel: Visualized portions  within the abdomen are unremarkable. Vascular/Lymphatic: Aortic atherosclerosis identified. No aneurysm. Other:  None. Musculoskeletal: No suspicious bone lesions identified. IMPRESSION: 1. Exam detail is diminished due to lack of IV contrast material and respiratory motion artifact. 2. Corresponding to the ultrasound abnormality from 02/19/2017 is a focal contour abnormality with a suspected underlying 1.8 cm kidney lesion. This is incompletely characterized without IV contrast. An underlying solid kidney mass cannot be excluded. 3. Right kidney cysts. 4. Bilateral pleural effusions. Electronically Signed   By: Kerby Moors M.D.   On: 02/20/2017 15:18   US Renal  Result Date: 02/19/2017 CLINICAL DATA:  Acute renal failure. Previous left nephrectomy for renal cell carcinoma. EXAM: RENAL / URINARY TRACT ULTRASOUND COMPLETE COMPARISON:  None. FINDINGS: Right Kidney: Length: 9.6 cm. Echogenicity within normal limits. A well-circumscribed hypoechoic lesion is seen in the midpole of the right kidney which measures 1.9 cm. This could represent a complex  cyst or solid mass. No hydronephrosis visualized. Left Kidney: Length: Surgically absent. Bladder: Appears normal for degree of bladder distention. IMPRESSION: No evidence of hydronephrosis. 1.9 cm hypoechoic lesion in right kidney, which could represent a complex cyst or solid neoplasm. Recommend abdomen MRI without contrast for further characterization. Previous left nephrectomy. Electronically Signed   By: Earle Gell M.D.   On: 02/19/2017 15:16   Dg Chest Port 1 View  Result Date: 02/21/2017 CLINICAL DATA:  Respiratory failure EXAM: PORTABLE CHEST 1 VIEW COMPARISON:  02/17/2017 FINDINGS: Similar asymmetric bilateral diffuse airspace process versus edema. Heart is enlarged. Small pleural effusions noted. Appearance remains nonspecific for pneumonia, atypical infection, or edema. No pneumothorax. Trachea is midline. Atherosclerosis noted of the aorta.  Degenerative changes of the spine. IMPRESSION: Stable diffuse bilateral airspace process, pneumonia or edema. Electronically Signed   By: Jerilynn Mages.  Shick M.D.   On: 02/21/2017 08:36     Medications:   . sodium chloride 50 mL/hr at 02/21/17 0700  . cefTRIAXone (ROCEPHIN)  IV Stopped (02/20/17 1018)   . anastrozole  1 mg Oral Daily  . azithromycin  500 mg Oral q1800  . calcium acetate  667 mg Oral TID WC  . docusate sodium  100 mg Oral BID  . ferrous sulfate  325 mg Oral Q breakfast  . heparin  5,000 Units Subcutaneous Q8H  . ipratropium-albuterol  3 mL Nebulization Q6H  . levothyroxine  88 mcg Oral QAC breakfast  . methylPREDNISolone (SOLU-MEDROL) injection  40 mg Intravenous Q6H  . metoprolol  200 mg Oral Daily  . oseltamivir  30 mg Oral Daily  . PARoxetine  10 mg Oral Daily  . pravastatin  20 mg Oral Daily   acetaminophen **OR** acetaminophen, ALPRAZolam, bisacodyl, HYDROcodone-acetaminophen, ondansetron **OR** ondansetron (ZOFRAN) IV, traZODone  Assessment/ Plan:  82 y.o. female with a PMHx of renal cell carcinoma status post left nephrectomy and partial right nephrectomy, hypertension, hyperlipidemia, anemia of chronic kidney disease, secondary hyperparathyroidism, osteoporosis, history of breast cancer, who was admitted to [facility] on 02/07/2017 for evaluation of significant shortness of breath.    1.  Acute renal failure due to hypontension, dehydration. 2.  CKD stage IV baseline Cr 3.0, CKD secondary to left nephrectomy and partial right nephrectomy. 3.  Anemia of CKD.   4.  Secondary hyperparathyroidism. 5.  Bacterial pneumonia. 6.  Right renal mass.   Plan:  Patient had advanced renal dysfunction at baseline.  Her baseline creatinine was 3.0.  Creatinine is up to 3.6 with an EGFR of 11.  We had a long discussion today about the initiation of dialysis.  There is still some hope that she may regain some renal function however she only has a partial right kidney to begin with.  Dr.  Celesta Aver to place temporary dialysis catheter.  We will start the patient on hemodialysis today.  Thereafter we will see if she recovers enough renal function.  If she does not she will need ongoing outpatient dialysis.  This was discussed with patient and her husband and they are agreeable to placement at [facility] should dialysis be needed as an outpatient. In regards to her right renal mass, we will need to consult with urology regarding this.  Patient still having significant shortness of breath with minimal movement.      LOS: 2 Dayonna Selbe 2/17/201910:46 AM

## 2017-02-21 NOTE — Progress Notes (Signed)
Pt remains on nrm , refused bipap at this time  , m.tukov aware

## 2017-02-21 NOTE — Progress Notes (Signed)
Pre HD Tx  

## 2017-02-21 NOTE — Progress Notes (Signed)
PULMONARY / CRITICAL CARE MEDICINE   Name: Erin Mccormick MRN: [id number] DOB: 03-19-1935    ADMISSION DATE:  02/17/2017    HISTORY OF PRESENT ILLNESS:   This is a 82 year old Caucasian female with a history of renal cell carcinoma status post nephrectomy, hypertension, hyperlipidemia, CKD, depression and anemia who presented to the ED with progressive shortness of breath times 5 days.  Patient states that symptoms started on Sunday with weakness and progressed to shortness of breath yesterday hence she went to her primary care provider and was diagnosed with pneumonia and started on antibiotics.  She went home and today she became acutely short of breath.  Upon arrival in the ED, patient's SPO2 was 68% on room air.  She is not on home O2 and denies any tobacco use.  She denies any history of congestive heart failure or major heart problems.  Shortness of breath is associated with chest weakness and no other symptoms.  At the ED, [place] chest CT showed diffuse inflammatory changes bilateral pleural effusions and small pericardial effusion.  He is being admitted to the ICU for further management  Patient became more short of breath in the night and needed to go back on BIPAP  REVIEW OF SYSTEMS:   Intermittent exertional shortness of breath   SUBJECTIVE:  Patient reports that she feels better but very concern about her exertional shortness of breath  VITAL SIGNS: BP (!) 153/71   Pulse 72   Temp (!) 97.2 F (36.2 C) (Oral)   Resp (!) 29   Ht 5\' 5"  (1.651 m)   Wt 129 lb 3 oz (58.6 kg)   SpO2 92%   BMI 21.50 kg/m   HEMODYNAMICS:  No compromis  VENTILATOR SETTINGS: FiO2 (%):  [44 %-50 %] 50 %  INTAKE / OUTPUT: I/O last 3 completed shifts: In: 2775 [P.O.:720; I.V.:1955; IV Piggyback:100] Out: 750 [Urine:750]  PHYSICAL EXAMINATION: General:  No distress at rest, comfortable Neuro:  Awake, alert x 4, no deficits HEENT:  PERRL, no JVD,  Cardiovascular: RRR, no oedema Lungs:  Diffuse  rhonchi Abdomen:  Soft, NT, +BS Musculoskeletal:  No deformities Skin:  Warm and dry  LABS:  BMET Recent Labs  Lab 02/19/17 0330 02/20/17 0541 02/21/17 0540  NA 136 138 137  K 4.2 4.8 5.0  CL 101 105 105  CO2 23 21* 20*  BUN 68* 82* 93*  CREATININE 3.61* 3.54* 3.60*  GLUCOSE 123* 181* 204*    Electrolytes Recent Labs  Lab 02/19/17 0330 02/19/17 1218 02/20/17 0541 02/21/17 0540  CALCIUM 8.8*  --  8.4* 8.4*  MG 1.7  --   --  2.1  PHOS 4.4 5.4* 5.7* 5.2*    CBC Recent Labs  Lab 02/13/2017 2129 02/19/17 0330 02/21/17 0540  WBC 7.3 7.8 11.3*  HGB 9.9* 9.0* 9.4*  HCT 29.6* 26.3* 28.1*  PLT 478* 424 558*    Coag's No results for input(s): APTT, INR in the last 168 hours.  Sepsis Markers Recent Labs  Lab 03/03/2017 2129 02/19/17 0330  LATICACIDVEN 1.1 0.7    ABG No results for input(s): PHART, PCO2ART, PO2ART in the last 168 hours.  Liver Enzymes Recent Labs  Lab 02/20/17 0541 02/21/17 0540  ALBUMIN 2.1* 2.3*    Cardiac Enzymes Recent Labs  Lab 02/24/2017 2129  TROPONINI <0.03    Glucose Recent Labs  Lab 02/19/17 0211 02/20/17 0734 02/21/17 0731  GLUCAP 124* 181* 199*    Imaging Dg Chest Port 1 View  Result Date: 02/21/2017  CLINICAL DATA:  Respiratory failure EXAM: PORTABLE CHEST 1 VIEW COMPARISON:  02/11/2017 FINDINGS: Similar asymmetric bilateral diffuse airspace process versus edema. Heart is enlarged. Small pleural effusions noted. Appearance remains nonspecific for pneumonia, atypical infection, or edema. No pneumothorax. Trachea is midline. Atherosclerosis noted of the aorta. Degenerative changes of the spine. IMPRESSION: Stable diffuse bilateral airspace process, pneumonia or edema. Electronically Signed   By: Jerilynn Mages.  Shick M.D.   On: 02/21/2017 08:36     STUDIES:  2/14 CT chest 2/16 MRI abdomen   CULTURES: 2/14 Blood- negative  ANTIBIOTICS: 2/15 Azithromycin, Ceftriaxone 2/16 Tamiflu  SIGNIFICANT EVENTS: 2/14 Admitted 2/17  Right femoral HD cath placed, HD started   LINES/TUBES: Peripheral, Right femoral HD cath  DISCUSSION: This 82 year old lady presented with worsening shortness of breath.  She is known to have CKD 4 secondary to left nephrectomy and partial right nephrectomy because of malignancy.  She has persistent exertional dysnoea and is being treated for CAP.  ASSESSMENT / PLAN: 1. Acute hypoxic Respiratory Failure secondary to community acquired pneumonia.  It is clear there is an additional component to this patient's lung condition.  At this point ILD or just fluid overload cannot be ruled out.  2. Stage 4 Renal disease with worsening function- patient has been seen by Dr Holley Raring and she will start HD today. Hopefully her pulmonary function will improve.  3. 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 Right kidney mass 5. Hx renal cell carcinoma 6. Hx hypertension  Plan 1. Continue ABX. Although Influenza swab is negative , patient started on renally dosed Tamiflu yesterday since she is somewhat immunocompromise 2. HD cath placed and patient to start HD this afternoon 3. Monitor cbc and transfuse for Hg<7 4. Wean Steroids 5. Lung recruitment     FAMILY  - Updates: given to husband    Luiz Blare, M.D [facility]monary [facility] [place] Pager: [phone]  02/21/2017, 4:44 PM

## 2017-02-21 NOTE — Progress Notes (Signed)
Post HD, Unable to complete dialysis, temp catheter not working, MD is aware and will exchange catheter, dialysis will resume tomorrow. Pt is stable. Report given to floor RN.

## 2017-02-21 NOTE — Progress Notes (Signed)
Patient ID: Erin Mccormick, female   DOB: 1935/11/28, 82 y.o.   MRN: [id number]   [facility] PROGRESS NOTE  CHIVONNE RASCON ZTI:[id number] DOB: 1935-09-05 DOA: 02/05/2017 PCP: Dion Body, MD  HPI/Subjective: Patient required BiPAP again last night.  Still with some cough and some shortness of breath.  Objective: Vitals:   02/21/17 1500 02/21/17 1519  BP: (!) 153/80   Pulse: 73 70  Resp: (!) 33 (!) 31  Temp:    SpO2: (!) 87% 90%    Filed Weights   02/06/2017 2114 02/20/17 0500 02/21/17 0500  Weight: 52.6 kg (116 lb) 54.9 kg (121 lb 0.5 oz) 58.6 kg (129 lb 3 oz)    ROS: Review of Systems  Constitutional: Negative for chills and fever.  Eyes: Negative for blurred vision.  Respiratory: Positive for cough and shortness of breath.   Cardiovascular: Negative for chest pain.  Gastrointestinal: Negative for abdominal pain, constipation, diarrhea, nausea and vomiting.  Genitourinary: Negative for dysuria.  Musculoskeletal: Negative for joint pain.  Neurological: Negative for dizziness and headaches.   Exam: Physical Exam  Constitutional: She is oriented to person, place, and time.  HENT:  Nose: No mucosal edema.  Mouth/Throat: No oropharyngeal exudate or posterior oropharyngeal edema.  Eyes: Conjunctivae, EOM and lids are normal. Pupils are equal, round, and reactive to light.  Neck: No JVD present. Carotid bruit is not present. No edema present. No thyroid mass and no thyromegaly present.  Cardiovascular: S1 normal and S2 normal. Exam reveals no gallop.  No murmur heard. Pulses:      Dorsalis pedis pulses are 2+ on the right side, and 2+ on the left side.  Respiratory: No respiratory distress. She has decreased breath sounds in the right middle field, the right lower field, the left middle field and the left lower field. She has no wheezes. She has rhonchi in the right lower field and the left lower field. She has no rales.  GI: Soft. Bowel sounds are normal. There is no  tenderness.  Musculoskeletal:       Right ankle: She exhibits no swelling.       Left ankle: She exhibits no swelling.  Lymphadenopathy:    She has no cervical adenopathy.  Neurological: She is alert and oriented to person, place, and time. No cranial nerve deficit.  Skin: Skin is warm. No rash noted. Nails show no clubbing.  Psychiatric: She has a normal mood and affect.      Data Reviewed: Basic Metabolic Panel: Recent Labs  Lab 02/16/2017 2129 02/19/17 0330 02/19/17 1218 02/20/17 0541 02/21/17 0540  NA 133* 136  --  138 137  K 4.3 4.2  --  4.8 5.0  CL 97* 101  --  105 105  CO2 24 23  --  21* 20*  GLUCOSE 136* 123*  --  181* 204*  BUN 70* 68*  --  82* 93*  CREATININE 3.51* 3.61*  --  3.54* 3.60*  CALCIUM 9.0 8.8*  --  8.4* 8.4*  MG  --  1.7  --   --  2.1  PHOS  --  4.4 5.4* 5.7* 5.2*   Liver Function Tests: Recent Labs  Lab 02/20/17 0541 02/21/17 0540  ALBUMIN 2.1* 2.3*   CBC: Recent Labs  Lab 02/25/2017 2129 02/19/17 0330 02/21/17 0540  WBC 7.3 7.8 11.3*  HGB 9.9* 9.0* 9.4*  HCT 29.6* 26.3* 28.1*  MCV 86.7 85.9 86.9  PLT 478* 424 558*   Cardiac Enzymes: Recent Labs  Lab  02/22/2017 2129  TROPONINI <0.03    CBG: Recent Labs  Lab 02/19/17 0211 02/20/17 0734 02/21/17 0731  GLUCAP 124* 181* 199*    Recent Results (from the past 240 hour(s))  Blood culture (routine x 2)     Status: None (Preliminary result)   Collection Time: 02/24/2017  9:29 PM  Result Value Ref Range Status   Specimen Description BLOOD BLOOD RIGHT FOREARM  Final   Special Requests   Final    BOTTLES DRAWN AEROBIC AND ANAEROBIC Blood Culture adequate volume   Culture   Final    NO GROWTH 3 DAYS Performed at [facility], [address], [place], [place] [address]    Report Status PENDING  Incomplete  MRSA PCR Screening     Status: None   Collection Time: 02/19/17  2:13 AM  Result Value Ref Range Status   MRSA by PCR NEGATIVE NEGATIVE Final    Comment:        The  GeneXpert MRSA Assay (FDA approved for NASAL specimens only), is one component of a comprehensive MRSA colonization surveillance program. It is not intended to diagnose MRSA infection nor to guide or monitor treatment for MRSA infections. Performed at [facility], [address] [place], [place] [address]   Blood culture (routine x 2)     Status: None (Preliminary result)   Collection Time: 02/19/17  3:30 AM  Result Value Ref Range Status   Specimen Description BLOOD RIGHT ANTECUBITAL  Final   Special Requests   Final    BOTTLES DRAWN AEROBIC AND ANAEROBIC Blood Culture adequate volume   Culture   Final    NO GROWTH 2 DAYS Performed at [facility], [address] [place],  [address]    Report Status PENDING  Incomplete     Studies: Mr Abdomen Wo Contrast  Result Date: 02/20/2017 CLINICAL DATA:  History of renal cell carcinoma status post left nephrectomy and partial right nephrectomy. Abdominal mass. EXAM: MRI ABDOMEN WITHOUT CONTRAST TECHNIQUE: Multiplanar multisequence MR imaging was performed without the administration of intravenous contrast. COMPARISON:  Ultrasound 02/19/2017 and CT abdomen pelvis 08/05/2015 FINDINGS: Significantly diminished exam detail due to motion artifact. Exam detail also diminished due to lack of IV contrast material. Lower chest: Moderate bilateral pleural effusions identified. Hepatobiliary: No focal liver abnormality identified. Previous cholecystectomy. No biliary dilatation. Pancreas: No mass, inflammatory changes, or other parenchymal abnormality identified. Spleen:  Within normal limits in size and appearance. Adrenals/Urinary Tract: No adrenal mass visualized. Status post left nephrectomy. Arising from the upper pole of right kidney is a T2 hyperintense and T1 isointense structure measuring 9 mm, image 19 of series 14. Several additional small T2 hyperintense cortical based lesions noted. Arising from the lateral cortex of  the interpolar right kidney is a focal contour deformity corresponding to the suspected lesion identified on recent ultrasound. This measures approximate 1.8 cm and is incompletely characterized without IV contrast. This lesion appears isointense on the T1 weighted images and isointense on the T2 weighted images. Stomach/Bowel: Visualized portions within the abdomen are unremarkable. Vascular/Lymphatic: Aortic atherosclerosis identified. No aneurysm. Other:  None. Musculoskeletal: No suspicious bone lesions identified. IMPRESSION: 1. Exam detail is diminished due to lack of IV contrast material and respiratory motion artifact. 2. Corresponding to the ultrasound abnormality from 02/19/2017 is a focal contour abnormality with a suspected underlying 1.8 cm kidney lesion. This is incompletely characterized without IV contrast. An underlying solid kidney mass cannot be excluded. 3. Right kidney cysts. 4. Bilateral  pleural effusions. Electronically Signed   By: Kerby Moors M.D.   On: 02/20/2017 15:18   Dg Chest Port 1 View  Result Date: 02/21/2017 CLINICAL DATA:  Respiratory failure EXAM: PORTABLE CHEST 1 VIEW COMPARISON:  02/15/2017 FINDINGS: Similar asymmetric bilateral diffuse airspace process versus edema. Heart is enlarged. Small pleural effusions noted. Appearance remains nonspecific for pneumonia, atypical infection, or edema. No pneumothorax. Trachea is midline. Atherosclerosis noted of the aorta. Degenerative changes of the spine. IMPRESSION: Stable diffuse bilateral airspace process, pneumonia or edema. Electronically Signed   By: Jerilynn Mages.  Shick M.D.   On: 02/21/2017 08:36    Scheduled Meds: . anastrozole  1 mg Oral Daily  . azithromycin  500 mg Oral q1800  . calcium acetate  667 mg Oral TID WC  . docusate sodium  100 mg Oral BID  . ferrous sulfate  325 mg Oral Q breakfast  . heparin  5,000 Units Subcutaneous Q8H  . ipratropium-albuterol  3 mL Nebulization Q6H  . levothyroxine  88 mcg Oral QAC  breakfast  . methylPREDNISolone (SOLU-MEDROL) injection  40 mg Intravenous Q6H  . metoprolol  200 mg Oral Daily  . oseltamivir  30 mg Oral Daily  . PARoxetine  10 mg Oral Daily  . pravastatin  20 mg Oral Daily   Continuous Infusions: . sodium chloride 50 mL/hr at 02/21/17 0700  . sodium chloride    . sodium chloride    . cefTRIAXone (ROCEPHIN)  IV Stopped (02/21/17 1130)    Assessment/Plan:  1. Acute hypoxic respiratory failure.  Patient on 5 L of oxygen during the day and was on BiPAP again last night. 2. Bilateral pneumonia.  Patient on Rocephin and Zithromax and Solu-Medrol 3. Acute kidney disease on chronic kidney disease stage 4.  Nephrology has critical care specialist to put in a dialysis catheter to start dialysis today.  Potentially part of the respiratory failure could be secondary to fluid overload.  Last echocardiogram showed normal ejection fraction so this would be acute diastolic congestive heart failure. 4. Right renal mass.  MRI without contrast did not show more than what the ultrasound showed. 5. Hypothyroidism unspecified on levothyroxine 6. Anxiety depression on Paxil 7. Essential hypertension on metoprolol 8. Hyperlipidemia unspecified on pravastatin 9. History of breast cancer on anastrozole  Code Status:     Code Status Orders  (From admission, onward)        Start     Ordered   02/19/17 0226  Full code  Continuous     02/19/17 0225    Code Status History    Date Active Date Inactive Code Status Order ID Comments User Context   07/01/2016 08:53 07/02/2016 14:29 Full Code [id number]  Saundra Shelling, MD Inpatient    Advance Directive Documentation     Most Recent Value  Type of Advance Directive  Living will  Pre-existing out of facility DNR order (yellow form or pink MOST form)  No data  "MOST" Form in Place?  No data     Family Communication: As per critical care team Disposition Plan: To be determined  Consultants:  Critical care  specialist  Nephrology  Antibiotics:  Rocephin  Zithromax  Time spent: 24 minutes  [place]

## 2017-02-21 NOTE — Progress Notes (Signed)
Pt unable to tolerate venti mask 55% .placed basck on NRB 100 % TOLERATING WELL SAT 98% HR 65

## 2017-02-21 NOTE — Progress Notes (Signed)
Dr. Celesta Aver rewired temporary dialysis catheter for patient. Patient had to lie flat for procedure and after sat back up had coughing fit and had difficulty recovering oxygen saturations. Placed on non-rebreather mask as patient repeated stated she did not want to go back on bipap.

## 2017-02-22 ENCOUNTER — Inpatient Hospital Stay: Payer: Medicare Other

## 2017-02-22 DIAGNOSIS — R918 Other nonspecific abnormal finding of lung field: Secondary | ICD-10-CM

## 2017-02-22 LAB — CBC WITH DIFFERENTIAL/PLATELET
Basophils Absolute: 0 10*3/uL (ref 0–0.1)
Basophils Relative: 0 %
EOS ABS: 0 10*3/uL (ref 0–0.7)
EOS PCT: 0 %
HCT: 27.3 % — ABNORMAL LOW (ref 35.0–47.0)
Hemoglobin: 9 g/dL — ABNORMAL LOW (ref 12.0–16.0)
Lymphocytes Relative: 3 %
Lymphs Abs: 0.3 10*3/uL — ABNORMAL LOW (ref 1.0–3.6)
MCH: 28.5 pg (ref 26.0–34.0)
MCHC: 33 g/dL (ref 32.0–36.0)
MCV: 86.5 fL (ref 80.0–100.0)
MONO ABS: 0.3 10*3/uL (ref 0.2–0.9)
MONOS PCT: 3 %
Neutro Abs: 8 10*3/uL — ABNORMAL HIGH (ref 1.4–6.5)
Neutrophils Relative %: 94 %
PLATELETS: 436 10*3/uL (ref 150–440)
RBC: 3.15 MIL/uL — ABNORMAL LOW (ref 3.80–5.20)
RDW: 14.2 % (ref 11.5–14.5)
WBC: 8.6 10*3/uL (ref 3.6–11.0)

## 2017-02-22 LAB — GLOMERULAR BASEMENT MEMBRANE ANTIBODIES: GBM Ab: 3 units (ref 0–20)

## 2017-02-22 LAB — PROTEIN ELECTROPHORESIS, SERUM
A/G RATIO SPE: 0.7 (ref 0.7–1.7)
ALBUMIN ELP: 2.3 g/dL — AB (ref 2.9–4.4)
ALPHA-1-GLOBULIN: 0.4 g/dL (ref 0.0–0.4)
ALPHA-2-GLOBULIN: 1.4 g/dL — AB (ref 0.4–1.0)
BETA GLOBULIN: 0.8 g/dL (ref 0.7–1.3)
GAMMA GLOBULIN: 0.6 g/dL (ref 0.4–1.8)
Globulin, Total: 3.1 g/dL (ref 2.2–3.9)
Total Protein ELP: 5.4 g/dL — ABNORMAL LOW (ref 6.0–8.5)

## 2017-02-22 LAB — RENAL FUNCTION PANEL
Albumin: 2.2 g/dL — ABNORMAL LOW (ref 3.5–5.0)
Anion gap: 11 (ref 5–15)
BUN: 99 mg/dL — ABNORMAL HIGH (ref 6–20)
CO2: 21 mmol/L — AB (ref 22–32)
CREATININE: 3.24 mg/dL — AB (ref 0.44–1.00)
Calcium: 8.5 mg/dL — ABNORMAL LOW (ref 8.9–10.3)
Chloride: 108 mmol/L (ref 101–111)
GFR calc non Af Amer: 13 mL/min — ABNORMAL LOW (ref >60)
GFR, EST AFRICAN AMERICAN: 15 mL/min — AB (ref >60)
GLUCOSE: 220 mg/dL — AB (ref 65–99)
Phosphorus: 4.5 mg/dL (ref 2.5–4.6)
Potassium: 5.1 mmol/L (ref 3.5–5.1)
SODIUM: 140 mmol/L (ref 135–145)

## 2017-02-22 LAB — PROTEIN ELECTRO, RANDOM URINE
ALPHA-1-GLOBULIN, U: 2.2 %
ALPHA-2-GLOBULIN, U: 8.6 %
Albumin ELP, Urine: 64.6 %
Beta Globulin, U: 11.4 %
Gamma Globulin, U: 13.2 %
TOTAL PROTEIN, URINE-UPE24: 65.2 mg/dL

## 2017-02-22 LAB — GLUCOSE, CAPILLARY: GLUCOSE-CAPILLARY: 196 mg/dL — AB (ref 65–99)

## 2017-02-22 MED ORDER — SODIUM CHLORIDE 0.9 % IV SOLN: 100.0000 mL | INTRAVENOUS | Status: DC | PRN

## 2017-02-22 MED ORDER — ALTEPLASE 2 MG IJ SOLR: 2.0000 mg | Freq: Once | INTRAMUSCULAR | Status: DC | PRN

## 2017-02-22 NOTE — Progress Notes (Signed)
B/p dropped, pt states she feels tired. UFR reduced to minimum. B/P cuff adjusted and B/P rechecked. MD notified.

## 2017-02-22 NOTE — Progress Notes (Signed)
Patient ID: Erin Mccormick, female   DOB: 1935-08-01, 82 y.o.   MRN: [id number]  [facility] PROGRESS NOTE  MADDYN LIEURANCE FGH:[id number] DOB: 1935/02/06 DOA: 02/06/2017 PCP: Dion Body, MD  HPI/Subjective: Patient feeling better.  Breathing a little bit better after dialysis.  Down to 4 L nasal cannula.  Objective: Vitals:   02/22/17 1500 02/22/17 1600  BP: 140/63 136/68  Pulse: 62 70  Resp: (!) 26 15  Temp:    SpO2: 91% 92%    Filed Weights   02/22/17 0342 02/22/17 0830 02/22/17 1015  Weight: 58 kg (127 lb 13.9 oz) 58 kg (127 lb 13.9 oz) 57.8 kg (127 lb 6.8 oz)    ROS: Review of Systems  Constitutional: Negative for chills and fever.  Eyes: Negative for blurred vision.  Respiratory: Positive for shortness of breath. Negative for cough.   Cardiovascular: Negative for chest pain.  Gastrointestinal: Negative for abdominal pain, constipation, diarrhea, nausea and vomiting.  Genitourinary: Negative for dysuria.  Musculoskeletal: Negative for joint pain.  Neurological: Negative for dizziness and headaches.   Exam: Physical Exam  Constitutional: She is oriented to person, place, and time.  HENT:  Nose: No mucosal edema.  Mouth/Throat: No oropharyngeal exudate or posterior oropharyngeal edema.  Eyes: Conjunctivae, EOM and lids are normal. Pupils are equal, round, and reactive to light.  Neck: No JVD present. Carotid bruit is not present. No edema present. No thyroid mass and no thyromegaly present.  Cardiovascular: S1 normal and S2 normal. Exam reveals no gallop.  No murmur heard. Pulses:      Dorsalis pedis pulses are 2+ on the right side, and 2+ on the left side.  Respiratory: No respiratory distress. She has decreased breath sounds in the right middle field, the right lower field and the left lower field. She has no wheezes. She has rhonchi in the right lower field and the left lower field. She has no rales.  GI: Soft. Bowel sounds are normal. There is no  tenderness.  Musculoskeletal:       Right ankle: She exhibits no swelling.       Left ankle: She exhibits no swelling.  Lymphadenopathy:    She has no cervical adenopathy.  Neurological: She is alert and oriented to person, place, and time. No cranial nerve deficit.  Skin: Skin is warm. No rash noted. Nails show no clubbing.  Psychiatric: She has a normal mood and affect.      Data Reviewed: Basic Metabolic Panel: Recent Labs  Lab 03/03/2017 2129  02/19/17 0330 02/19/17 1218 02/20/17 0541 02/21/17 0540 02/21/17 1400 02/22/17 0450  NA 133*  --  136  --  138 137  --  140  K 4.3  --  4.2  --  4.8 5.0  --  5.1  CL 97*  --  101  --  105 105  --  108  CO2 24  --  23  --  21* 20*  --  21*  GLUCOSE 136*  --  123*  --  181* 204*  --  220*  BUN 70*  --  68*  --  82* 93*  --  99*  CREATININE 3.51*  --  3.61*  --  3.54* 3.60*  --  3.24*  CALCIUM 9.0  --  8.8*  --  8.4* 8.4*  --  8.5*  MG  --   --  1.7  --   --  2.1  --   --   PHOS  --    < >  4.4 5.4* 5.7* 5.2* 4.6 4.5   < > = values in this interval not displayed.   Liver Function Tests: Recent Labs  Lab 02/20/17 0541 02/21/17 0540 02/22/17 0450  ALBUMIN 2.1* 2.3* 2.2*   CBC: Recent Labs  Lab 02/24/2017 2129 02/19/17 0330 02/21/17 0540 02/22/17 0450  WBC 7.3 7.8 11.3* 8.6  NEUTROABS  --   --   --  8.0*  HGB 9.9* 9.0* 9.4* 9.0*  HCT 29.6* 26.3* 28.1* 27.3*  MCV 86.7 85.9 86.9 86.5  PLT 478* 424 558* 436     CBG: Recent Labs  Lab 02/19/17 0211 02/20/17 0734 02/21/17 0731 02/22/17 0719  GLUCAP 124* 181* 199* 196*    Recent Results (from the past 240 hour(s))  Blood culture (routine x 2)     Status: None (Preliminary result)   Collection Time: 02/28/2017  9:29 PM  Result Value Ref Range Status   Specimen Description BLOOD BLOOD RIGHT FOREARM  Final   Special Requests   Final    BOTTLES DRAWN AEROBIC AND ANAEROBIC Blood Culture adequate volume   Culture   Final    NO GROWTH 4 DAYS Performed at [facility], [address] [place], [place] [address]    Report Status PENDING  Incomplete  MRSA PCR Screening     Status: None   Collection Time: 02/19/17  2:13 AM  Result Value Ref Range Status   MRSA by PCR NEGATIVE NEGATIVE Final    Comment:        The GeneXpert MRSA Assay (FDA approved for NASAL specimens only), is one component of a comprehensive MRSA colonization surveillance program. It is not intended to diagnose MRSA infection nor to guide or monitor treatment for MRSA infections. Performed at [facility], [address] [place], [place] [address]   Blood culture (routine x 2)     Status: None (Preliminary result)   Collection Time: 02/19/17  3:30 AM  Result Value Ref Range Status   Specimen Description BLOOD RIGHT ANTECUBITAL  Final   Special Requests   Final    BOTTLES DRAWN AEROBIC AND ANAEROBIC Blood Culture adequate volume   Culture   Final    NO GROWTH 3 DAYS Performed at [facility], [address], [place], [place] [address]    Report Status PENDING  Incomplete     Studies: Dg Chest Port 1 View  Result Date: 02/22/2017 CLINICAL DATA:  Respiratory failure EXAM: PORTABLE CHEST 1 VIEW COMPARISON:  February 21, 2017 FINDINGS: There is airspace opacity throughout both lungs bilaterally, somewhat more on the right than on the left, stable. There is cardiomegaly with pulmonary venous hypertension. There are stable small pleural effusions. No adenopathy. There is aortic atherosclerosis. No evident bone lesions. IMPRESSION: Pulmonary vascular congestion. Widespread airspace opacity bilaterally. Question pneumonia versus alveolar edema; both entities may exist concurrently. Widespread influenza pneumonitis may have this appearance. The overall distribution and extent of opacity is stable compared to most recent study. Stable cardiac silhouette. There is aortic atherosclerosis. Aortic Atherosclerosis (ICD10-I70.0). Electronically Signed   By: Lowella Grip III M.D.   On: 02/22/2017 07:43   Dg Chest Port 1 View  Result Date: 02/21/2017 CLINICAL DATA:  Respiratory failure EXAM: PORTABLE CHEST 1 VIEW COMPARISON:  02/19/2017 FINDINGS: Similar asymmetric bilateral diffuse airspace process versus edema. Heart is enlarged. Small pleural effusions noted. Appearance remains nonspecific for pneumonia, atypical infection, or edema. No pneumothorax. Trachea is midline. Atherosclerosis noted of the aorta. Degenerative changes of the spine.  IMPRESSION: Stable diffuse bilateral airspace process, pneumonia or edema. Electronically Signed   By: Jerilynn Mages.  Shick M.D.   On: 02/21/2017 08:36    Scheduled Meds: . anastrozole  1 mg Oral Daily  . azithromycin  500 mg Oral q1800  . calcium acetate  667 mg Oral TID WC  . docusate sodium  100 mg Oral BID  . ferrous sulfate  325 mg Oral Q breakfast  . heparin  5,000 Units Subcutaneous Q8H  . ipratropium-albuterol  3 mL Nebulization Q6H  . levothyroxine  88 mcg Oral QAC breakfast  . lidocaine  10 mL Intradermal Once  . methylPREDNISolone (SOLU-MEDROL) injection  40 mg Intravenous Q12H  . metoprolol  200 mg Oral Daily  . PARoxetine  10 mg Oral Daily  . pravastatin  20 mg Oral Daily   Continuous Infusions: . sodium chloride    . sodium chloride    . sodium chloride    . sodium chloride      Assessment/Plan:   1. Acute hypoxic respiratory failure.  Patient down to 4 L nasal cannula.  Was on nonrebreather mask last night. 2. Bilateral pneumonia on Rocephin and Zithromax and Solu-Medrol 3. Acute kidney disease on chronic kidney disease stage IV.  Nephrology started dialysis this morning.  Patient history of renal cell cancer status post left nephrectomy and partial nephrectomy on right 4. Acute diastolic congestive heart failure.  Dialysis to help out with fluid removal 5. Right renal mass. 6. Hypothyroidism unspecified on levothyroxine 7. Anxiety depression on Paxil 8. Hyperlipidemia unspecified on  pravastatin 9. Essential hypertension on metoprolol 10. History of breast cancer on anastrozole  Code Status:     Code Status Orders  (From admission, onward)        Start     Ordered   02/19/17 0226  Full code  Continuous     02/19/17 0225    Code Status History    Date Active Date Inactive Code Status Order ID Comments User Context   07/01/2016 08:53 07/02/2016 14:29 Full Code [id number]  Saundra Shelling, MD Inpatient    Advance Directive Documentation     Most Recent Value  Type of Advance Directive  Living will  Pre-existing out of facility DNR order (yellow form or pink MOST form)  No data  "MOST" Form in Place?  No data     Family Communication: Husband at the bedside Disposition Plan: To be determined  Consultants:  Critical care specialist  Nephrologist  Antibiotics:  Rocephin  Zithromax  24 minutes Time spent:   [employer]

## 2017-02-22 NOTE — Progress Notes (Signed)
HD Tx started w/o issue, catheter seems to be working better.

## 2017-02-22 NOTE — Progress Notes (Signed)
Presently comfortable on [place] O2 Denies SOB and pain Underwent dialysis for the first time today Nonoliguric  Vitals:   02/22/17 1300 02/22/17 1400 02/22/17 1500 02/22/17 1600  BP: (!) 161/68 137/60 140/63 136/68  Pulse: 66 64 62 70  Resp: (!) 30 (!) 27 (!) 26 15  Temp: 97.6 F (36.4 C)     TempSrc: Oral     SpO2: 96% 98% 91% 92%  Weight:      Height:      4 LPM [place]  Gen: NAD, cognition intact HEENT: NCAT, sclerae white, oropharynx normal Neck: No LAN, no JVD noted Lungs: Mildly diminished breath sounds, bibasilar crackles, no wheezes Cardiovascular: Regular, no M noted Abdomen: Soft, NT, +BS Ext: no C/C/E Neuro: grossly intact Skin: No lesions noted  BMP Latest Ref Rng & Units 02/22/2017 02/21/2017 02/20/2017  Glucose 65 - 99 mg/dL 220(H) 204(H) 181(H)  BUN 6 - 20 mg/dL 99(H) 93(H) 82(H)  Creatinine 0.44 - 1.00 mg/dL 3.24(H) 3.60(H) 3.54(H)  Sodium 135 - 145 mmol/L 140 137 138  Potassium 3.5 - 5.1 mmol/L 5.1 5.0 4.8  Chloride 101 - 111 mmol/L 108 105 105  CO2 22 - 32 mmol/L 21(L) 20(L) 21(L)  Calcium 8.9 - 10.3 mg/dL 8.5(L) 8.4(L) 8.4(L)    CBC Latest Ref Rng & Units 02/22/2017 02/21/2017 02/19/2017  WBC 3.6 - 11.0 K/uL 8.6 11.3(H) 7.8  Hemoglobin 12.0 - 16.0 g/dL 9.0(L) 9.4(L) 9.0(L)  Hematocrit 35.0 - 47.0 % 27.3(L) 28.1(L) 26.3(L)  Platelets 150 - 440 K/uL 436 558(H) 424     Results for orders placed or performed during the hospital encounter of 02/15/2017  Blood culture (routine x 2)     Status: None (Preliminary result)   Collection Time: 02/22/2017  9:29 PM  Result Value Ref Range Status   Specimen Description BLOOD BLOOD RIGHT FOREARM  Final   Special Requests   Final    BOTTLES DRAWN AEROBIC AND ANAEROBIC Blood Culture adequate volume   Culture   Final    NO GROWTH 4 DAYS Performed at [facility], [address] [place], [place] [address]    Report Status PENDING  Incomplete  MRSA PCR Screening     Status: None   Collection Time: 02/19/17  2:13 AM   Result Value Ref Range Status   MRSA by PCR NEGATIVE NEGATIVE Final    Comment:        The GeneXpert MRSA Assay (FDA approved for NASAL specimens only), is one component of a comprehensive MRSA colonization surveillance program. It is not intended to diagnose MRSA infection nor to guide or monitor treatment for MRSA infections. Performed at [facility], [address] [place], [place] [address]   Blood culture (routine x 2)     Status: None (Preliminary result)   Collection Time: 02/19/17  3:30 AM  Result Value Ref Range Status   Specimen Description BLOOD RIGHT ANTECUBITAL  Final   Special Requests   Final    BOTTLES DRAWN AEROBIC AND ANAEROBIC Blood Culture adequate volume   Culture   Final    NO GROWTH 3 DAYS Performed at [facility], [address], [place], [place] [address]    Report Status PENDING  Incomplete    Flu PCR negative  Anti-infectives (From admission, onward)   Start     Dose/Rate Route Frequency Ordered Stop   02/20/17 1815  oseltamivir (TAMIFLU) capsule 30 mg  Status:  Discontinued     30 mg Oral Daily 02/20/17 1811 02/22/17  1059   02/19/17 2200  azithromycin (ZITHROMAX) 500 mg in sodium chloride 0.9 % 250 mL IVPB  Status:  Discontinued     500 mg 250 mL/hr over 60 Minutes Intravenous Every 24 hours 02/19/17 0225 02/19/17 1156   02/19/17 1800  azithromycin (ZITHROMAX) tablet 500 mg     500 mg Oral Daily-1800 02/19/17 1156 02/23/17 2359   02/19/17 1000  cefTRIAXone (ROCEPHIN) 1 g in sodium chloride 0.9 % 100 mL IVPB  Status:  Discontinued     1 g 200 mL/hr over 30 Minutes Intravenous Every 24 hours 02/19/17 0225 02/22/17 1059   02/09/2017 2230  azithromycin (ZITHROMAX) 500 mg in sodium chloride 0.9 % 250 mL IVPB     500 mg 250 mL/hr over 60 Minutes Intravenous  Once 02/05/2017 2228 02/19/17 0129   03/02/2017 2230  cefTRIAXone (ROCEPHIN) 1 g in sodium chloride 0.9 % 100 mL IVPB     1 g 200 mL/hr over 30 Minutes Intravenous  Once  02/17/2017 2228 02/19/17 0027      CXR: Symmetric bilateral infiltrates  IMP: Acute hypoxemic respiratory failure Bilateral pulmonary infiltrates -edema vs pneumonitis vs atypical pneumonia AKI/CKD Chronic anemia Right kidney mass History of renal cell carcinoma History of hypertension  PLAN/REC: Monitor and SDU today Continue a azithromycin Discontinue ceftriaxone Discontinue oseltamivir HD per nephrology Continue systemic steroids at current dose Follow CXR intermittently  Merton Border, MD PCCM service Mobile [phone] Pager [phone] 02/22/2017 4:21 PM

## 2017-02-22 NOTE — Progress Notes (Signed)
[facility]  ROUNDING NOTE   Subjective:  MRI of the abdomen was performed this admission and did show a 1.8 cm right renal mass. However it could not be fully characterize given the lack of contrast. She would likely need urology follow-up. Renal function remains low   Tolerated HD well this morning However no fluid could be removed due to drop in BP during Treatment Still requiring [place] by O2  Objective:  Vital signs in last 24 hours:  Temp:  [97.2 F (36.2 C)-98.3 F (36.8 C)] 97.4 F (36.3 C) (02/18 0830) Pulse Rate:  [59-79] 69 (02/18 1000) Resp:  [16-39] 18 (02/18 1000) BP: (99-162)/(48-106) 145/61 (02/18 1000) SpO2:  [70 %-100 %] 98 % (02/18 1000) FiO2 (%):  [100 %] 100 % (02/18 0742) Weight:  [58 kg (127 lb 13.9 oz)-59.3 kg (130 lb 11.7 oz)] 58 kg (127 lb 13.9 oz) (02/18 0830)  Weight change: 0.1 kg (3.5 oz) Filed Weights   02/21/17 1700 02/22/17 0342 02/22/17 0830  Weight: 59.3 kg (130 lb 11.7 oz) 58 kg (127 lb 13.9 oz) 58 kg (127 lb 13.9 oz)    Intake/Output: I/O last 3 completed shifts: In: 1680 [P.O.:480; I.V.:1100; IV Piggyback:100] Out: 598 [Urine:1050]   Intake/Output this shift:  No intake/output data recorded.  Physical Exam: General: No acute distress  Head: Normocephalic, atraumatic. Moist oral mucosal membranes  Eyes: Anicteric  Neck: Supple, trachea midline  Lungs:  Scattered rhonchi, normal effort, [place] O2  Heart: S1S2 no rubs  Abdomen:  Soft, nontender, bowel sounds present  Extremities: No peripheral edema.  Neurologic: Awake, alert, following commands  Skin: No lesions       Basic Metabolic Panel: Recent Labs  Lab 02/28/2017 2129  02/19/17 0330 02/19/17 1218 02/20/17 0541 02/21/17 0540 02/21/17 1400 02/22/17 0450  NA 133*  --  136  --  138 137  --  140  K 4.3  --  4.2  --  4.8 5.0  --  5.1  CL 97*  --  101  --  105 105  --  108  CO2 24  --  23  --  21* 20*  --  21*  GLUCOSE 136*  --  123*  --  181* 204*  --  220*   BUN 70*  --  68*  --  82* 93*  --  99*  CREATININE 3.51*  --  3.61*  --  3.54* 3.60*  --  3.24*  CALCIUM 9.0  --  8.8*  --  8.4* 8.4*  --  8.5*  MG  --   --  1.7  --   --  2.1  --   --   PHOS  --    < > 4.4 5.4* 5.7* 5.2* 4.6 4.5   < > = values in this interval not displayed.    Liver Function Tests: Recent Labs  Lab 02/20/17 0541 02/21/17 0540 02/22/17 0450  ALBUMIN 2.1* 2.3* 2.2*   No results for input(s): LIPASE, AMYLASE in the last 168 hours. No results for input(s): AMMONIA in the last 168 hours.  CBC: Recent Labs  Lab 02/26/2017 2129 02/19/17 0330 02/21/17 0540 02/22/17 0450  WBC 7.3 7.8 11.3* 8.6  NEUTROABS  --   --   --  8.0*  HGB 9.9* 9.0* 9.4* 9.0*  HCT 29.6* 26.3* 28.1* 27.3*  MCV 86.7 85.9 86.9 86.5  PLT 478* 424 558* 436    Cardiac Enzymes: Recent Labs  Lab 03/04/2017 2129  TROPONINI <0.03  BNP: Invalid input(s): POCBNP  CBG: Recent Labs  Lab 02/19/17 0211 02/20/17 0734 02/21/17 0731 02/22/17 0719  GLUCAP 124* 181* 199* 196*    Microbiology: Results for orders placed or performed during the hospital encounter of 02/10/2017  Blood culture (routine x 2)     Status: None (Preliminary result)   Collection Time: 02/25/2017  9:29 PM  Result Value Ref Range Status   Specimen Description BLOOD BLOOD RIGHT FOREARM  Final   Special Requests   Final    BOTTLES DRAWN AEROBIC AND ANAEROBIC Blood Culture adequate volume   Culture   Final    NO GROWTH 4 DAYS Performed at [facility], [address], [place], [place] [address]    Report Status PENDING  Incomplete  MRSA PCR Screening     Status: None   Collection Time: 02/19/17  2:13 AM  Result Value Ref Range Status   MRSA by PCR NEGATIVE NEGATIVE Final    Comment:        The GeneXpert MRSA Assay (FDA approved for NASAL specimens only), is one component of a comprehensive MRSA colonization surveillance program. It is not intended to diagnose MRSA infection nor to guide or monitor  treatment for MRSA infections. Performed at [facility], [address] [place], [place] [address]   Blood culture (routine x 2)     Status: None (Preliminary result)   Collection Time: 02/19/17  3:30 AM  Result Value Ref Range Status   Specimen Description BLOOD RIGHT ANTECUBITAL  Final   Special Requests   Final    BOTTLES DRAWN AEROBIC AND ANAEROBIC Blood Culture adequate volume   Culture   Final    NO GROWTH 3 DAYS Performed at [facility], [address] [place], [place] [address]    Report Status PENDING  Incomplete    Coagulation Studies: No results for input(s): LABPROT, INR in the last 72 hours.  Urinalysis: No results for input(s): COLORURINE, LABSPEC, PHURINE, GLUCOSEU, HGBUR, BILIRUBINUR, KETONESUR, PROTEINUR, UROBILINOGEN, NITRITE, LEUKOCYTESUR in the last 72 hours.  Invalid input(s): APPERANCEUR    Imaging: Mr Abdomen Wo Contrast  Result Date: 02/20/2017 CLINICAL DATA:  History of renal cell carcinoma status post left nephrectomy and partial right nephrectomy. Abdominal mass. EXAM: MRI ABDOMEN WITHOUT CONTRAST TECHNIQUE: Multiplanar multisequence MR imaging was performed without the administration of intravenous contrast. COMPARISON:  Ultrasound 02/19/2017 and CT abdomen pelvis 08/05/2015 FINDINGS: Significantly diminished exam detail due to motion artifact. Exam detail also diminished due to lack of IV contrast material. Lower chest: Moderate bilateral pleural effusions identified. Hepatobiliary: No focal liver abnormality identified. Previous cholecystectomy. No biliary dilatation. Pancreas: No mass, inflammatory changes, or other parenchymal abnormality identified. Spleen:  Within normal limits in size and appearance. Adrenals/Urinary Tract: No adrenal mass visualized. Status post left nephrectomy. Arising from the upper pole of right kidney is a T2 hyperintense and T1 isointense structure measuring 9 mm, image 19 of series 14. Several additional  small T2 hyperintense cortical based lesions noted. Arising from the lateral cortex of the interpolar right kidney is a focal contour deformity corresponding to the suspected lesion identified on recent ultrasound. This measures approximate 1.8 cm and is incompletely characterized without IV contrast. This lesion appears isointense on the T1 weighted images and isointense on the T2 weighted images. Stomach/Bowel: Visualized portions within the abdomen are unremarkable. Vascular/Lymphatic: Aortic atherosclerosis identified. No aneurysm. Other:  None. Musculoskeletal: No suspicious bone lesions identified. IMPRESSION: 1. Exam detail is diminished due to lack of IV contrast  material and respiratory motion artifact. 2. Corresponding to the ultrasound abnormality from 02/19/2017 is a focal contour abnormality with a suspected underlying 1.8 cm kidney lesion. This is incompletely characterized without IV contrast. An underlying solid kidney mass cannot be excluded. 3. Right kidney cysts. 4. Bilateral pleural effusions. Electronically Signed   By: Kerby Moors M.D.   On: 02/20/2017 15:18   Dg Chest Port 1 View  Result Date: 02/22/2017 CLINICAL DATA:  Respiratory failure EXAM: PORTABLE CHEST 1 VIEW COMPARISON:  February 21, 2017 FINDINGS: There is airspace opacity throughout both lungs bilaterally, somewhat more on the right than on the left, stable. There is cardiomegaly with pulmonary venous hypertension. There are stable small pleural effusions. No adenopathy. There is aortic atherosclerosis. No evident bone lesions. IMPRESSION: Pulmonary vascular congestion. Widespread airspace opacity bilaterally. Question pneumonia versus alveolar edema; both entities may exist concurrently. Widespread influenza pneumonitis may have this appearance. The overall distribution and extent of opacity is stable compared to most recent study. Stable cardiac silhouette. There is aortic atherosclerosis. Aortic Atherosclerosis  (ICD10-I70.0). Electronically Signed   By: Lowella Grip III M.D.   On: 02/22/2017 07:43   Dg Chest Port 1 View  Result Date: 02/21/2017 CLINICAL DATA:  Respiratory failure EXAM: PORTABLE CHEST 1 VIEW COMPARISON:  02/10/2017 FINDINGS: Similar asymmetric bilateral diffuse airspace process versus edema. Heart is enlarged. Small pleural effusions noted. Appearance remains nonspecific for pneumonia, atypical infection, or edema. No pneumothorax. Trachea is midline. Atherosclerosis noted of the aorta. Degenerative changes of the spine. IMPRESSION: Stable diffuse bilateral airspace process, pneumonia or edema. Electronically Signed   By: Jerilynn Mages.  Shick M.D.   On: 02/21/2017 08:36     Medications:   . sodium chloride    . sodium chloride    . sodium chloride    . sodium chloride    . cefTRIAXone (ROCEPHIN)  IV 1 g (02/22/17 1037)   . anastrozole  1 mg Oral Daily  . azithromycin  500 mg Oral q1800  . calcium acetate  667 mg Oral TID WC  . docusate sodium  100 mg Oral BID  . ferrous sulfate  325 mg Oral Q breakfast  . heparin  5,000 Units Subcutaneous Q8H  . ipratropium-albuterol  3 mL Nebulization Q6H  . levothyroxine  88 mcg Oral QAC breakfast  . lidocaine  10 mL Intradermal Once  . methylPREDNISolone (SOLU-MEDROL) injection  40 mg Intravenous Q12H  . metoprolol  200 mg Oral Daily  . oseltamivir  30 mg Oral Daily  . PARoxetine  10 mg Oral Daily  . pravastatin  20 mg Oral Daily   sodium chloride, sodium chloride, sodium chloride, sodium chloride, acetaminophen **OR** acetaminophen, ALPRAZolam, alteplase, bisacodyl, heparin, HYDROcodone-acetaminophen, lidocaine-prilocaine, ondansetron **OR** ondansetron (ZOFRAN) IV, pentafluoroprop-tetrafluoroeth, traZODone  Assessment/ Plan:  82 y.o. female with a PMHx of renal cell carcinoma status post left nephrectomy (08/2000) and partial right nephrectomy (06/2002), hypertension, hyperlipidemia, anemia of chronic kidney disease, secondary  hyperparathyroidism, osteoporosis, history of breast cancer, who was admitted to [facility] on 02/27/2017 for evaluation of significant shortness of breath.    1.  Acute renal failure due to hypontension, dehydration. 2.  CKD stage IV baseline Cr 3.0, CKD secondary to left nephrectomy and partial right nephrectomy. 3.  Anemia of CKD.   4.  Secondary hyperparathyroidism. 5.  Bacterial pneumonia. 6.  1.8 cm Right renal mass.   Plan:  Patient underwent HD today No fluid removed with HD due to intradialytic hypotension Monitor UOP and resp status; monitor renal panel Will  decide about further HD based on clinical response Patient's husband verbalized that he would like to get the renal mass evaluated at [facility]     LOS: 3 Balraj Brayfield 2/18/201910:53 AM

## 2017-02-22 NOTE — Progress Notes (Signed)
Pre HD assessment, received report from floor RN, pt stable for treatment.

## 2017-02-22 NOTE — Progress Notes (Signed)
Patient had her first dialysis treatment this morning. Post treatment she stated she felt better and RN was able to titrate down nasal cannula O2 to 4 L nasal cannula. Patient voided once into bedpan. No reports of pain, shortness of breath, or nausea.

## 2017-02-22 NOTE — Progress Notes (Signed)
B/P on recheck is improved, pt reports feeling better.

## 2017-02-22 NOTE — Progress Notes (Signed)
Post HD Tx, pt tolerated tx well, uf goal not achieved, net uf of 5mL. Pt is stable, report given to ICU RN.

## 2017-02-22 NOTE — Progress Notes (Signed)
Inpatient Diabetes Program Recommendations  AACE/ADA: New Consensus Statement on Inpatient Glycemic Control (2015)  Target Ranges:  Prepandial:   less than 140 mg/dL      Peak postprandial:   less than 180 mg/dL (1-2 hours)      Critically ill patients:  140 - 180 mg/dL  Results for Erin Mccormick, Erin Mccormick (MRN [id number]) as of 02/22/2017 08:48  Ref. Range 02/22/2017 07:19  Glucose-Capillary Latest Ref Range: 65 - 99 mg/dL 196 (H)   Results for Erin Mccormick, Erin Mccormick (MRN [id number]) as of 02/22/2017 08:48  Ref. Range 02/07/2017 21:29 02/19/2017 03:30 02/20/2017 05:41 02/21/2017 05:40 02/22/2017 04:50  Glucose Latest Ref Range: 65 - 99 mg/dL 136 (H) 123 (H) 181 (H) 204 (H) 220 (H)   Review of Glycemic Control  Diabetes history: No Outpatient Diabetes medications: NA Current orders for Inpatient glycemic control: None  Inpatient Diabetes Program Recommendations: Correction (SSI): While inpatient and ordered steroids, please consider ordering CBGs with Novolog correction scale ACHS. A1C: Please consider ordering an A1C to evaluate glycemic control over the past 2-3 months.  Thanks, Barnie Alderman, RN, MSN, CDE Diabetes Coordinator Inpatient Diabetes Program [phone] (Team Pager from 8am to 5pm)

## 2017-02-22 NOTE — Progress Notes (Signed)
Pre HD Tx  

## 2017-02-22 NOTE — Consult Note (Signed)
Urology was counseled about this patient who has a solitary kidney and is admitted in the ICU for respiratory decompensation.  She also has acute renal failure on chronic renal failure.  She has a history of Renal Cell Carcinoma and is s/p left neprhectomy. She was also recently noted to have a new suspicious renal mass measuring approximately 1.9 cm.  The patient is followed by [facility] for her kidney cancer (Dr. Rutherford Limerick).  She should reestablish care with them once she is discharged from the hospital for further evaluation.  If the patient would like to follow up closer to home, she can see Dr. Erlene Quan at [place].  Please contact [facility] if the patient would like to be seen there.  This can be established within the next 4-6 weeks, as there is little urgency here given the size of the renal mass and her other comorbidities.   Please contact urology with any additional questions or concerns.

## 2017-02-23 LAB — BASIC METABOLIC PANEL
Anion gap: 8 (ref 5–15)
BUN: 90 mg/dL — AB (ref 6–20)
CALCIUM: 8.5 mg/dL — AB (ref 8.9–10.3)
CHLORIDE: 106 mmol/L (ref 101–111)
CO2: 24 mmol/L (ref 22–32)
Creatinine, Ser: 2.99 mg/dL — ABNORMAL HIGH (ref 0.44–1.00)
GFR calc non Af Amer: 14 mL/min — ABNORMAL LOW (ref >60)
GFR, EST AFRICAN AMERICAN: 16 mL/min — AB (ref >60)
Glucose, Bld: 193 mg/dL — ABNORMAL HIGH (ref 65–99)
Potassium: 5.1 mmol/L (ref 3.5–5.1)
Sodium: 138 mmol/L (ref 135–145)

## 2017-02-23 LAB — CBC WITH DIFFERENTIAL/PLATELET
BASOS ABS: 0 10*3/uL (ref 0–0.1)
BASOS PCT: 0 %
EOS ABS: 0 10*3/uL (ref 0–0.7)
Eosinophils Relative: 0 %
HEMATOCRIT: 27.9 % — AB (ref 35.0–47.0)
HEMOGLOBIN: 9.3 g/dL — AB (ref 12.0–16.0)
LYMPHS ABS: 0.4 10*3/uL — AB (ref 1.0–3.6)
LYMPHS PCT: 4 %
MCH: 28.9 pg (ref 26.0–34.0)
MCHC: 33.2 g/dL (ref 32.0–36.0)
MCV: 87.3 fL (ref 80.0–100.0)
MONOS PCT: 6 %
Monocytes Absolute: 0.6 10*3/uL (ref 0.2–0.9)
Neutro Abs: 9 10*3/uL — ABNORMAL HIGH (ref 1.4–6.5)
Neutrophils Relative %: 90 %
Platelets: 401 10*3/uL (ref 150–440)
RBC: 3.2 MIL/uL — ABNORMAL LOW (ref 3.80–5.20)
RDW: 14.4 % (ref 11.5–14.5)
Smear Review: ADEQUATE
WBC: 10 10*3/uL (ref 3.6–11.0)

## 2017-02-23 LAB — CULTURE, BLOOD (ROUTINE X 2)
CULTURE: NO GROWTH
Special Requests: ADEQUATE

## 2017-02-23 LAB — HEPATITIS B SURFACE ANTIBODY, QUANTITATIVE: Hepatitis B-Post: <3.1 m[IU]/mL — ABNORMAL LOW (ref >9.9)

## 2017-02-23 LAB — HEPATITIS B CORE ANTIBODY, IGM: HEP B C IGM: NEGATIVE

## 2017-02-23 LAB — HEPATITIS B SURFACE ANTIGEN: HEP B S AG: NEGATIVE

## 2017-02-23 LAB — GLUCOSE, CAPILLARY: Glucose-Capillary: 165 mg/dL — ABNORMAL HIGH (ref 65–99)

## 2017-02-23 LAB — PARATHYROID HORMONE, INTACT (NO CA): PTH: 38 pg/mL (ref 15–65)

## 2017-02-23 NOTE — Progress Notes (Signed)
[facility]  ROUNDING NOTE   Subjective:  MRI of the abdomen was performed this admission and did show a 1.8 cm right renal mass. However it could not be fully characterize given the lack of contrast. She would likely need urology follow-up.  Renal function remains low   Tolerated HD yesterday However no fluid could be removed due to drop in BP during Treatment Still requiring [place] by O2 Overall feels better Serum creatinine 2.99, potassium 5.1 today 24-hour urine collection ordered  Objective:  Vital signs in last 24 hours:  Temp:  [97.8 F (36.6 C)-98.1 F (36.7 C)] 97.9 F (36.6 C) (02/19 1200) Pulse Rate:  [53-75] 67 (02/19 1200) Resp:  [14-31] 20 (02/19 1200) BP: (130-173)/(61-86) 153/67 (02/19 1100) SpO2:  [83 %-98 %] 97 % (02/19 1200) FiO2 (%):  [55 %] 55 % (02/19 0200) Weight:  [54.5 kg (120 lb 2.4 oz)] 54.5 kg (120 lb 2.4 oz) (02/19 0500)  Weight change: -0.7 kg (-8.7 oz) Filed Weights   02/22/17 0830 02/22/17 1015 02/23/17 0500  Weight: 58 kg (127 lb 13.9 oz) 57.8 kg (127 lb 6.8 oz) 54.5 kg (120 lb 2.4 oz)    Intake/Output: I/O last 3 completed shifts: In: 600 [P.O.:600] Out: 1395 [RWERX:5400; Other:5]   Intake/Output this shift:  No intake/output data recorded.  Physical Exam: General: No acute distress  Head: Normocephalic, atraumatic. Moist oral mucosal membranes  Eyes: Anicteric  Neck: Supple, trachea midline  Lungs:  Scattered rhonchi, normal effort, [place] O2  Heart: S1S2 no rubs  Abdomen:  Soft, nontender, bowel sounds present  Extremities: + peripheral edema.  Neurologic: Awake, alert, following commands  Skin: No lesions       Basic Metabolic Panel: Recent Labs  Lab 02/19/17 0330 02/19/17 1218 02/20/17 0541 02/21/17 0540 02/21/17 1400 02/22/17 0450 02/23/17 0540  NA 136  --  138 137  --  140 138  K 4.2  --  4.8 5.0  --  5.1 5.1  CL 101  --  105 105  --  108 106  CO2 23  --  21* 20*  --  21* 24  GLUCOSE 123*  --  181*  204*  --  220* 193*  BUN 68*  --  82* 93*  --  99* 90*  CREATININE 3.61*  --  3.54* 3.60*  --  3.24* 2.99*  CALCIUM 8.8*  --  8.4* 8.4*  --  8.5* 8.5*  MG 1.7  --   --  2.1  --   --   --   PHOS 4.4 5.4* 5.7* 5.2* 4.6 4.5  --     Liver Function Tests: Recent Labs  Lab 02/20/17 0541 02/21/17 0540 02/22/17 0450  ALBUMIN 2.1* 2.3* 2.2*   No results for input(s): LIPASE, AMYLASE in the last 168 hours. No results for input(s): AMMONIA in the last 168 hours.  CBC: Recent Labs  Lab 02/13/2017 2129 02/19/17 0330 02/21/17 0540 02/22/17 0450 02/23/17 0540  WBC 7.3 7.8 11.3* 8.6 10.0  NEUTROABS  --   --   --  8.0* 9.0*  HGB 9.9* 9.0* 9.4* 9.0* 9.3*  HCT 29.6* 26.3* 28.1* 27.3* 27.9*  MCV 86.7 85.9 86.9 86.5 87.3  PLT 478* 424 558* 436 401    Cardiac Enzymes: Recent Labs  Lab 02/23/2017 2129  TROPONINI <0.03    BNP: Invalid input(s): POCBNP  CBG: Recent Labs  Lab 02/19/17 0211 02/20/17 0734 02/21/17 0731 02/22/17 0719 02/23/17 0757  GLUCAP 124* 181* 199* 196* 165*  Microbiology: Results for orders placed or performed during the hospital encounter of 03/04/2017  Blood culture (routine x 2)     Status: None   Collection Time: 02/08/2017  9:29 PM  Result Value Ref Range Status   Specimen Description BLOOD BLOOD RIGHT FOREARM  Final   Special Requests   Final    BOTTLES DRAWN AEROBIC AND ANAEROBIC Blood Culture adequate volume   Culture   Final    NO GROWTH 5 DAYS Performed at [facility], [address] [place], [place] [address]    Report Status 02/23/2017 FINAL  Final  MRSA PCR Screening     Status: None   Collection Time: 02/19/17  2:13 AM  Result Value Ref Range Status   MRSA by PCR NEGATIVE NEGATIVE Final    Comment:        The GeneXpert MRSA Assay (FDA approved for NASAL specimens only), is one component of a comprehensive MRSA colonization surveillance program. It is not intended to diagnose MRSA infection nor to guide or monitor treatment  for MRSA infections. Performed at [facility], [address] [place], [place] [address]   Blood culture (routine x 2)     Status: None (Preliminary result)   Collection Time: 02/19/17  3:30 AM  Result Value Ref Range Status   Specimen Description BLOOD RIGHT ANTECUBITAL  Final   Special Requests   Final    BOTTLES DRAWN AEROBIC AND ANAEROBIC Blood Culture adequate volume   Culture   Final    NO GROWTH 4 DAYS Performed at [facility], [address] [place],  [address]    Report Status PENDING  Incomplete    Coagulation Studies: No results for input(s): LABPROT, INR in the last 72 hours.  Urinalysis: No results for input(s): COLORURINE, LABSPEC, PHURINE, GLUCOSEU, HGBUR, BILIRUBINUR, KETONESUR, PROTEINUR, UROBILINOGEN, NITRITE, LEUKOCYTESUR in the last 72 hours.  Invalid input(s): APPERANCEUR    Imaging: Dg Chest Port 1 View  Result Date: 02/22/2017 CLINICAL DATA:  Respiratory failure EXAM: PORTABLE CHEST 1 VIEW COMPARISON:  February 21, 2017 FINDINGS: There is airspace opacity throughout both lungs bilaterally, somewhat more on the right than on the left, stable. There is cardiomegaly with pulmonary venous hypertension. There are stable small pleural effusions. No adenopathy. There is aortic atherosclerosis. No evident bone lesions. IMPRESSION: Pulmonary vascular congestion. Widespread airspace opacity bilaterally. Question pneumonia versus alveolar edema; both entities may exist concurrently. Widespread influenza pneumonitis may have this appearance. The overall distribution and extent of opacity is stable compared to most recent study. Stable cardiac silhouette. There is aortic atherosclerosis. Aortic Atherosclerosis (ICD10-I70.0). Electronically Signed   By: Lowella Grip III M.D.   On: 02/22/2017 07:43     Medications:   . sodium chloride    . sodium chloride    . sodium chloride    . sodium chloride     . anastrozole  1 mg Oral Daily   . azithromycin  500 mg Oral q1800  . calcium acetate  667 mg Oral TID WC  . docusate sodium  100 mg Oral BID  . ferrous sulfate  325 mg Oral Q breakfast  . heparin  5,000 Units Subcutaneous Q8H  . ipratropium-albuterol  3 mL Nebulization Q6H  . levothyroxine  88 mcg Oral QAC breakfast  . lidocaine  10 mL Intradermal Once  . metoprolol  200 mg Oral Daily  . PARoxetine  10 mg Oral Daily  . pravastatin  20 mg Oral Daily   sodium chloride, sodium  chloride, sodium chloride, sodium chloride, acetaminophen **OR** acetaminophen, ALPRAZolam, alteplase, bisacodyl, heparin, HYDROcodone-acetaminophen, lidocaine-prilocaine, ondansetron **OR** ondansetron (ZOFRAN) IV, pentafluoroprop-tetrafluoroeth, traZODone  Assessment/ Plan:  82 y.o. female with a PMHx of renal cell carcinoma status post left nephrectomy (08/2000) and partial right nephrectomy (06/2002), hypertension, hyperlipidemia, anemia of chronic kidney disease, secondary hyperparathyroidism, osteoporosis, history of breast cancer, who was admitted to [facility] on 03/02/2017 for evaluation of significant shortness of breath.    1.  Acute renal failure due to hypontension, dehydration. 2.  CKD stage IV baseline Cr 3.0, CKD secondary to left nephrectomy and partial right nephrectomy. 3.  Anemia of CKD.   4.  Secondary hyperparathyroidism. 5.  Bacterial pneumonia. 6.  1.8 cm Right renal mass.   Plan:  Patient underwent HD on Monday 2/18 No fluid removed with HD due to intradialytic hypotension Monitor UOP and resp status; monitor renal panel Will decide about further HD based on clinical response Patient's husband verbalized that he would like to get the renal mass evaluated at [facility] 24-hour urine for creatinine clearance     LOS: 4 Selene Peltzer 2/19/20195:01 PM

## 2017-02-23 NOTE — Progress Notes (Signed)
No respiratory distress today, vss. 24 hour urine collection started and 0900 and continued. Family at bedside and updated on plan of care. Wilnette Kales

## 2017-02-23 NOTE — Progress Notes (Signed)
Last night, required BiPAP for hypoxemia.  Then Venturi mask. Presently comfortable on [place] O2 Denies SOB and pain Remains nonoliguric  Vitals:   02/23/17 0900 02/23/17 1000 02/23/17 1100 02/23/17 1200  BP: (!) 152/64 (!) 158/63 (!) 153/67   Pulse: 66 61 64 67  Resp: 14 (!) 21 (!) 29 20  Temp:    97.9 F (36.6 C)  TempSrc:    Axillary  SpO2: 93% 98% 95% 97%  Weight:      Height:      5 LPM [place]  Gen: NAD, cognition intact HEENT: NCAT, sclerae white, oropharynx normal Neck: No LAN, no JVD noted Lungs: Bibasilar crackles, no wheezes Cardiovascular: Regular, no M noted Abdomen: Soft, NT, +BS Ext: no C/C/E Neuro: grossly intact Skin: No lesions noted  BMP Latest Ref Rng & Units 02/23/2017 02/22/2017 02/21/2017  Glucose 65 - 99 mg/dL 193(H) 220(H) 204(H)  BUN 6 - 20 mg/dL 90(H) 99(H) 93(H)  Creatinine 0.44 - 1.00 mg/dL 2.99(H) 3.24(H) 3.60(H)  Sodium 135 - 145 mmol/L 138 140 137  Potassium 3.5 - 5.1 mmol/L 5.1 5.1 5.0  Chloride 101 - 111 mmol/L 106 108 105  CO2 22 - 32 mmol/L 24 21(L) 20(L)  Calcium 8.9 - 10.3 mg/dL 8.5(L) 8.5(L) 8.4(L)    CBC Latest Ref Rng & Units 02/23/2017 02/22/2017 02/21/2017  WBC 3.6 - 11.0 K/uL 10.0 8.6 11.3(H)  Hemoglobin 12.0 - 16.0 g/dL 9.3(L) 9.0(L) 9.4(L)  Hematocrit 35.0 - 47.0 % 27.9(L) 27.3(L) 28.1(L)  Platelets 150 - 440 K/uL 401 436 558(H)     Results for orders placed or performed during the hospital encounter of 03/03/2017  Blood culture (routine x 2)     Status: None   Collection Time: 02/14/2017  9:29 PM  Result Value Ref Range Status   Specimen Description BLOOD BLOOD RIGHT FOREARM  Final   Special Requests   Final    BOTTLES DRAWN AEROBIC AND ANAEROBIC Blood Culture adequate volume   Culture   Final    NO GROWTH 5 DAYS Performed at [facility], [address] [place], [place] [address]    Report Status 02/23/2017 FINAL  Final  MRSA PCR Screening     Status: None   Collection Time: 02/19/17  2:13 AM  Result Value Ref Range  Status   MRSA by PCR NEGATIVE NEGATIVE Final    Comment:        The GeneXpert MRSA Assay (FDA approved for NASAL specimens only), is one component of a comprehensive MRSA colonization surveillance program. It is not intended to diagnose MRSA infection nor to guide or monitor treatment for MRSA infections. Performed at [facility], [address] [place], [place] [address]   Blood culture (routine x 2)     Status: None (Preliminary result)   Collection Time: 02/19/17  3:30 AM  Result Value Ref Range Status   Specimen Description BLOOD RIGHT ANTECUBITAL  Final   Special Requests   Final    BOTTLES DRAWN AEROBIC AND ANAEROBIC Blood Culture adequate volume   Culture   Final    NO GROWTH 4 DAYS Performed at [facility], [address] [place], [place] [address]    Report Status PENDING  Incomplete    Flu PCR negative  Anti-infectives (From admission, onward)   Start     Dose/Rate Route Frequency Ordered Stop   02/20/17 1815  oseltamivir (TAMIFLU) capsule 30 mg  Status:  Discontinued     30 mg Oral Daily 02/20/17 1811 02/22/17  1059   02/19/17 2200  azithromycin (ZITHROMAX) 500 mg in sodium chloride 0.9 % 250 mL IVPB  Status:  Discontinued     500 mg 250 mL/hr over 60 Minutes Intravenous Every 24 hours 02/19/17 0225 02/19/17 1156   02/19/17 1800  azithromycin (ZITHROMAX) tablet 500 mg     500 mg Oral Daily-1800 02/19/17 1156 02/23/17 2359   02/19/17 1000  cefTRIAXone (ROCEPHIN) 1 g in sodium chloride 0.9 % 100 mL IVPB  Status:  Discontinued     1 g 200 mL/hr over 30 Minutes Intravenous Every 24 hours 02/19/17 0225 02/22/17 1059   02/20/2017 2230  azithromycin (ZITHROMAX) 500 mg in sodium chloride 0.9 % 250 mL IVPB     500 mg 250 mL/hr over 60 Minutes Intravenous  Once 02/14/2017 2228 02/19/17 0129   02/28/2017 2230  cefTRIAXone (ROCEPHIN) 1 g in sodium chloride 0.9 % 100 mL IVPB     1 g 200 mL/hr over 30 Minutes Intravenous  Once 02/20/2017 2228 02/19/17 0027       CXR: [place] symmetric bilateral infiltrates -edema versus pneumonitis pattern  IMP: Acute hypoxemic respiratory failure Bilateral pulmonary infiltrates -edema vs pneumonitis vs atypical pneumonia AKI/CKD Chronic anemia Right kidney mass History of renal cell carcinoma History of hypertension  PLAN/REC: Monitor in SDU today Complete 5-day course of azithromycin Discussed with Dr. Candiss Norse.  No dialysis planned today Discontinue systemic steroids  Follow CXR intermittently -if she gets dialyzed, would like to see if CXR infiltrates improve  Merton Border, MD PCCM service Mobile [phone] Pager [phone] 02/23/2017 3:37 PM

## 2017-02-23 NOTE — Progress Notes (Signed)
Patient ID: Erin Mccormick, female   DOB: June 29, 1935, 82 y.o.   MRN: [id number]  [employer] PROGRESS NOTE  JAYLYNNE BIRKHEAD YQM:[id number] DOB: 1935-10-26 DOA: 02/23/2017 PCP: Dion Body, MD  HPI/Subjective: Patient feeling a little bit better.  Still requiring quite a bit of oxygen.  Some cough.  Some shortness of breath.  Objective: Vitals:   02/23/17 1600 02/23/17 1700  BP: (!) 170/76 (!) 170/74  Pulse: 66 65  Resp: (!) 28 19  Temp: 98.1 F (36.7 C)   SpO2: 97% 95%    Filed Weights   02/22/17 0830 02/22/17 1015 02/23/17 0500  Weight: 58 kg (127 lb 13.9 oz) 57.8 kg (127 lb 6.8 oz) 54.5 kg (120 lb 2.4 oz)    ROS: Review of Systems  Constitutional: Negative for chills and fever.  Eyes: Negative for blurred vision.  Respiratory: Positive for cough and shortness of breath.   Cardiovascular: Negative for chest pain.  Gastrointestinal: Negative for abdominal pain, constipation, diarrhea, nausea and vomiting.  Genitourinary: Negative for dysuria.  Musculoskeletal: Negative for joint pain.  Neurological: Negative for dizziness and headaches.   Exam: Physical Exam  Constitutional: She is oriented to person, place, and time.  HENT:  Nose: No mucosal edema.  Mouth/Throat: No oropharyngeal exudate or posterior oropharyngeal edema.  Eyes: Conjunctivae, EOM and lids are normal. Pupils are equal, round, and reactive to light.  Neck: No JVD present. Carotid bruit is not present. No edema present. No thyroid mass and no thyromegaly present.  Cardiovascular: S1 normal and S2 normal. Exam reveals no gallop.  No murmur heard. Pulses:      Dorsalis pedis pulses are 2+ on the right side, and 2+ on the left side.  Respiratory: No respiratory distress. She has decreased breath sounds in the right middle field, the right lower field and the left lower field. She has no wheezes. She has rhonchi in the right lower field and the left lower field. She has no rales.  GI: Soft. Bowel  sounds are normal. There is no tenderness.  Musculoskeletal:       Right ankle: She exhibits no swelling.       Left ankle: She exhibits no swelling.  Lymphadenopathy:    She has no cervical adenopathy.  Neurological: She is alert and oriented to person, place, and time. No cranial nerve deficit.  Skin: Skin is warm. No rash noted. Nails show no clubbing.  Psychiatric: She has a normal mood and affect.      Data Reviewed: Basic Metabolic Panel: Recent Labs  Lab 02/19/17 0330 02/19/17 1218 02/20/17 0541 02/21/17 0540 02/21/17 1400 02/22/17 0450 02/23/17 0540  NA 136  --  138 137  --  140 138  K 4.2  --  4.8 5.0  --  5.1 5.1  CL 101  --  105 105  --  108 106  CO2 23  --  21* 20*  --  21* 24  GLUCOSE 123*  --  181* 204*  --  220* 193*  BUN 68*  --  82* 93*  --  99* 90*  CREATININE 3.61*  --  3.54* 3.60*  --  3.24* 2.99*  CALCIUM 8.8*  --  8.4* 8.4*  --  8.5* 8.5*  MG 1.7  --   --  2.1  --   --   --   PHOS 4.4 5.4* 5.7* 5.2* 4.6 4.5  --    Liver Function Tests: Recent Labs  Lab 02/20/17 0541 02/21/17 0540 02/22/17  1696  ALBUMIN 2.1* 2.3* 2.2*   CBC: Recent Labs  Lab 02/20/2017 2129 02/19/17 0330 02/21/17 0540 02/22/17 0450 02/23/17 0540  WBC 7.3 7.8 11.3* 8.6 10.0  NEUTROABS  --   --   --  8.0* 9.0*  HGB 9.9* 9.0* 9.4* 9.0* 9.3*  HCT 29.6* 26.3* 28.1* 27.3* 27.9*  MCV 86.7 85.9 86.9 86.5 87.3  PLT 478* 424 558* 436 401     CBG: Recent Labs  Lab 02/19/17 0211 02/20/17 0734 02/21/17 0731 02/22/17 0719 02/23/17 0757  GLUCAP 124* 181* 199* 196* 165*    Recent Results (from the past 240 hour(s))  Blood culture (routine x 2)     Status: None   Collection Time: 02/22/2017  9:29 PM  Result Value Ref Range Status   Specimen Description BLOOD BLOOD RIGHT FOREARM  Final   Special Requests   Final    BOTTLES DRAWN AEROBIC AND ANAEROBIC Blood Culture adequate volume   Culture   Final    NO GROWTH 5 DAYS Performed at [facility], [address] [place], [place] [address]    Report Status 02/23/2017 FINAL  Final  MRSA PCR Screening     Status: None   Collection Time: 02/19/17  2:13 AM  Result Value Ref Range Status   MRSA by PCR NEGATIVE NEGATIVE Final    Comment:        The GeneXpert MRSA Assay (FDA approved for NASAL specimens only), is one component of a comprehensive MRSA colonization surveillance program. It is not intended to diagnose MRSA infection nor to guide or monitor treatment for MRSA infections. Performed at [facility], [address] [place], [place] [address]   Blood culture (routine x 2)     Status: None (Preliminary result)   Collection Time: 02/19/17  3:30 AM  Result Value Ref Range Status   Specimen Description BLOOD RIGHT ANTECUBITAL  Final   Special Requests   Final    BOTTLES DRAWN AEROBIC AND ANAEROBIC Blood Culture adequate volume   Culture   Final    NO GROWTH 4 DAYS Performed at [facility], [address], [place], [place] [address]    Report Status PENDING  Incomplete     Studies: Dg Chest Port 1 View  Result Date: 02/22/2017 CLINICAL DATA:  Respiratory failure EXAM: PORTABLE CHEST 1 VIEW COMPARISON:  February 21, 2017 FINDINGS: There is airspace opacity throughout both lungs bilaterally, somewhat more on the right than on the left, stable. There is cardiomegaly with pulmonary venous hypertension. There are stable small pleural effusions. No adenopathy. There is aortic atherosclerosis. No evident bone lesions. IMPRESSION: Pulmonary vascular congestion. Widespread airspace opacity bilaterally. Question pneumonia versus alveolar edema; both entities may exist concurrently. Widespread influenza pneumonitis may have this appearance. The overall distribution and extent of opacity is stable compared to most recent study. Stable cardiac silhouette. There is aortic atherosclerosis. Aortic Atherosclerosis (ICD10-I70.0). Electronically Signed   By: Lowella Grip III M.D.   On:  02/22/2017 07:43    Scheduled Meds: . anastrozole  1 mg Oral Daily  . calcium acetate  667 mg Oral TID WC  . docusate sodium  100 mg Oral BID  . ferrous sulfate  325 mg Oral Q breakfast  . heparin  5,000 Units Subcutaneous Q8H  . ipratropium-albuterol  3 mL Nebulization Q6H  . levothyroxine  88 mcg Oral QAC breakfast  . lidocaine  10 mL Intradermal Once  . metoprolol  200 mg Oral Daily  . PARoxetine  10 mg Oral Daily  . pravastatin  20 mg Oral Daily   Continuous Infusions: . sodium chloride    . sodium chloride    . sodium chloride    . sodium chloride      Assessment/Plan:   1. Acute hypoxic respiratory failure.  Patient down to 4 L nasal cannula. 2. Bilateral pneumonia.  Rocephin and Solu-Medrol stopped by critical care specialist.  On Zithromax. 3. Acute kidney disease on chronic kidney disease stage IV.  Nephrology started dialysis yesterday morning.  Patient history of renal cell cancer status post left nephrectomy and partial nephrectomy on right 4. Acute diastolic congestive heart failure.  Dialysis to help out with fluid removal 5. Right renal mass. 6. Hypothyroidism unspecified on levothyroxine 7. Anxiety depression on Paxil 8. Hyperlipidemia unspecified on pravastatin 9. Essential hypertension on metoprolol 10. History of breast cancer on anastrozole  Code Status:     Code Status Orders  (From admission, onward)        Start     Ordered   02/19/17 0226  Full code  Continuous     02/19/17 0225    Code Status History    Date Active Date Inactive Code Status Order ID Comments User Context   07/01/2016 08:53 07/02/2016 14:29 Full Code [id number]  Saundra Shelling, MD Inpatient    Advance Directive Documentation     Most Recent Value  Type of Advance Directive  Living will  Pre-existing out of facility DNR order (yellow form or pink MOST form)  No data  "MOST" Form in Place?  No data     Family Communication: Husband yesterday Disposition Plan: To be  determined  Consultants:  Critical care specialist  Nephrologist  Antibiotics:  Zithromax  Time spent: 25 minutes  [place]

## 2017-02-24 LAB — BASIC METABOLIC PANEL
Anion gap: 7 (ref 5–15)
BUN: 89 mg/dL — AB (ref 6–20)
CALCIUM: 8.8 mg/dL — AB (ref 8.9–10.3)
CO2: 26 mmol/L (ref 22–32)
CREATININE: 3 mg/dL — AB (ref 0.44–1.00)
Chloride: 108 mmol/L (ref 101–111)
GFR calc Af Amer: 16 mL/min — ABNORMAL LOW (ref >60)
GFR calc non Af Amer: 14 mL/min — ABNORMAL LOW (ref >60)
GLUCOSE: 128 mg/dL — AB (ref 65–99)
Potassium: 5.2 mmol/L — ABNORMAL HIGH (ref 3.5–5.1)
Sodium: 141 mmol/L (ref 135–145)

## 2017-02-24 LAB — CBC WITH DIFFERENTIAL/PLATELET
Basophils Absolute: 0.1 10*3/uL (ref 0–0.1)
Basophils Relative: 0 %
EOS ABS: 0.1 10*3/uL (ref 0–0.7)
Eosinophils Relative: 0 %
HCT: 28.4 % — ABNORMAL LOW (ref 35.0–47.0)
Hemoglobin: 9.2 g/dL — ABNORMAL LOW (ref 12.0–16.0)
LYMPHS ABS: 0.5 10*3/uL — AB (ref 1.0–3.6)
Lymphocytes Relative: 4 %
MCH: 28.3 pg (ref 26.0–34.0)
MCHC: 32.4 g/dL (ref 32.0–36.0)
MCV: 87.2 fL (ref 80.0–100.0)
MONO ABS: 0.5 10*3/uL (ref 0.2–0.9)
MONOS PCT: 4 %
Neutro Abs: 12.4 10*3/uL — ABNORMAL HIGH (ref 1.4–6.5)
Neutrophils Relative %: 92 %
Platelets: 375 10*3/uL (ref 150–440)
RBC: 3.25 MIL/uL — ABNORMAL LOW (ref 3.80–5.20)
RDW: 14.2 % (ref 11.5–14.5)
WBC: 13.5 10*3/uL — ABNORMAL HIGH (ref 3.6–11.0)

## 2017-02-24 LAB — CULTURE, BLOOD (ROUTINE X 2)
CULTURE: NO GROWTH
SPECIAL REQUESTS: ADEQUATE

## 2017-02-24 LAB — CREATININE CLEARANCE, URINE, 24 HOUR
CREAT CLEAR: 12 mL/min — AB (ref 75–115)
Collection Interval-CRCL: 24 hours
Creatinine, 24H Ur: 506 mg/d — ABNORMAL LOW (ref 600–1800)
Creatinine, Urine: 44 mg/dL
Urine Total Volume-CRCL: 1150 mL

## 2017-02-24 MED ORDER — METOPROLOL SUCCINATE ER 50 MG PO TB24
100.0000 mg | ORAL_TABLET | Freq: Every day | ORAL | Status: DC
  Administered 2017-02-24 – 2017-02-25 (×2): 100 mg via ORAL
  Filled 2017-02-24 (×2): qty 2

## 2017-02-24 MED ORDER — CLONIDINE HCL 0.1 MG PO TABS
0.1000 mg | ORAL_TABLET | Freq: Four times a day (QID) | ORAL | Status: DC | PRN
  Administered 2017-02-24: 0.1 mg via ORAL
  Filled 2017-02-24: qty 1

## 2017-02-24 NOTE — Progress Notes (Signed)
Pt experienced an anxiety attack, RN and MD notified, RN at bedside BP reassessed at 1346. Recheck 109/55. HR 67, 02sat 98

## 2017-02-24 NOTE — Progress Notes (Signed)
HD tx end. System terminated early r/t system clotting MD made aware, no new set up required.

## 2017-02-24 NOTE — Progress Notes (Signed)
Pre HD assessment  

## 2017-02-24 NOTE — Progress Notes (Signed)
HD tx end  

## 2017-02-24 NOTE — Progress Notes (Signed)
Post HD assessment  

## 2017-02-24 NOTE — Progress Notes (Signed)
[facility]  ROUNDING NOTE   Subjective:  MRI of the abdomen was performed this admission and did show a 1.8 cm right renal mass. However it could not be fully characterize given the lack of contrast. She would likely need urology follow-up.  Renal function remains low   No fluid could be removed with previous dialysis due to drop in BP during Treatment Still requiring [place] by O2 Overall feels better Serum creatinine stable at 3.0, potassium 5.2 today 24-hour urine collection is complete. Results awaited.  Patient continues to have a cough and oxygen requirement Appetite is poor ? If Uremia is playing a role  Objective:  Vital signs in last 24 hours:  Temp:  [97.9 F (36.6 C)-98.6 F (37 C)] 98.6 F (37 C) (02/20 0733) Pulse Rate:  [58-71] 71 (02/20 0900) Resp:  [19-36] 27 (02/20 0900) BP: (142-186)/(61-116) 186/65 (02/20 0900) SpO2:  [88 %-100 %] 93 % (02/20 0900)  Weight change:  Filed Weights   02/22/17 0830 02/22/17 1015 02/23/17 0500  Weight: 58 kg (127 lb 13.9 oz) 57.8 kg (127 lb 6.8 oz) 54.5 kg (120 lb 2.4 oz)    Intake/Output: I/O last 3 completed shifts: In: -  Out: 2040 [Urine:2040]   Intake/Output this shift:  No intake/output data recorded.  Physical Exam: General: No acute distress  Head: Normocephalic, atraumatic. Moist oral mucosal membranes  Eyes: Anicteric  Neck: Supple, trachea midline  Lungs:  B/l crackles, normal effort, [place] O2  Heart: S1S2 no rubs  Abdomen:  Soft, nontender, bowel sounds present  Extremities: + peripheral edema.  Neurologic: Awake, alert, following commands  Skin: No lesions   femoral dialysis catheter    Basic Metabolic Panel: Recent Labs  Lab 02/19/17 0330 02/19/17 1218 02/20/17 0541 02/21/17 0540 02/21/17 1400 02/22/17 0450 02/23/17 0540 02/24/17 0606  NA 136  --  138 137  --  140 138 141  K 4.2  --  4.8 5.0  --  5.1 5.1 5.2*  CL 101  --  105 105  --  108 106 108  CO2 23  --  21* 20*  --  21* 24  26  GLUCOSE 123*  --  181* 204*  --  220* 193* 128*  BUN 68*  --  82* 93*  --  99* 90* 89*  CREATININE 3.61*  --  3.54* 3.60*  --  3.24* 2.99* 3.00*  CALCIUM 8.8*  --  8.4* 8.4*  --  8.5* 8.5* 8.8*  MG 1.7  --   --  2.1  --   --   --   --   PHOS 4.4 5.4* 5.7* 5.2* 4.6 4.5  --   --     Liver Function Tests: Recent Labs  Lab 02/20/17 0541 02/21/17 0540 02/22/17 0450  ALBUMIN 2.1* 2.3* 2.2*   No results for input(s): LIPASE, AMYLASE in the last 168 hours. No results for input(s): AMMONIA in the last 168 hours.  CBC: Recent Labs  Lab 02/19/17 0330 02/21/17 0540 02/22/17 0450 02/23/17 0540 02/24/17 0606  WBC 7.8 11.3* 8.6 10.0 13.5*  NEUTROABS  --   --  8.0* 9.0* 12.4*  HGB 9.0* 9.4* 9.0* 9.3* 9.2*  HCT 26.3* 28.1* 27.3* 27.9* 28.4*  MCV 85.9 86.9 86.5 87.3 87.2  PLT 424 558* 436 401 375    Cardiac Enzymes: Recent Labs  Lab 02/28/2017 2129  TROPONINI <0.03    BNP: Invalid input(s): POCBNP  CBG: Recent Labs  Lab 02/19/17 0211 02/20/17 0734 02/21/17 0731  02/22/17 0719 02/23/17 0757  GLUCAP 124* 181* 199* 196* 165*    Microbiology: Results for orders placed or performed during the hospital encounter of 02/11/2017  Blood culture (routine x 2)     Status: None   Collection Time: 02/06/2017  9:29 PM  Result Value Ref Range Status   Specimen Description BLOOD BLOOD RIGHT FOREARM  Final   Special Requests   Final    BOTTLES DRAWN AEROBIC AND ANAEROBIC Blood Culture adequate volume   Culture   Final    NO GROWTH 5 DAYS Performed at [facility], [address] [place], [place] [address]    Report Status 02/23/2017 FINAL  Final  MRSA PCR Screening     Status: None   Collection Time: 02/19/17  2:13 AM  Result Value Ref Range Status   MRSA by PCR NEGATIVE NEGATIVE Final    Comment:        The GeneXpert MRSA Assay (FDA approved for NASAL specimens only), is one component of a comprehensive MRSA colonization surveillance program. It is not intended to  diagnose MRSA infection nor to guide or monitor treatment for MRSA infections. Performed at [facility], [address] [place], [place] [address]   Blood culture (routine x 2)     Status: None   Collection Time: 02/19/17  3:30 AM  Result Value Ref Range Status   Specimen Description BLOOD RIGHT ANTECUBITAL  Final   Special Requests   Final    BOTTLES DRAWN AEROBIC AND ANAEROBIC Blood Culture adequate volume   Culture   Final    NO GROWTH 5 DAYS Performed at [facility], [address] [place], [place] [address]    Report Status 02/24/2017 FINAL  Final    Coagulation Studies: No results for input(s): LABPROT, INR in the last 72 hours.  Urinalysis: No results for input(s): COLORURINE, LABSPEC, PHURINE, GLUCOSEU, HGBUR, BILIRUBINUR, KETONESUR, PROTEINUR, UROBILINOGEN, NITRITE, LEUKOCYTESUR in the last 72 hours.  Invalid input(s): APPERANCEUR    Imaging: No results found.   Medications:   . sodium chloride    . sodium chloride    . sodium chloride    . sodium chloride     . anastrozole  1 mg Oral Daily  . calcium acetate  667 mg Oral TID WC  . docusate sodium  100 mg Oral BID  . ferrous sulfate  325 mg Oral Q breakfast  . heparin  5,000 Units Subcutaneous Q8H  . ipratropium-albuterol  3 mL Nebulization Q6H  . levothyroxine  88 mcg Oral QAC breakfast  . lidocaine  10 mL Intradermal Once  . metoprolol  100 mg Oral QHS  . PARoxetine  10 mg Oral Daily  . pravastatin  20 mg Oral Daily   sodium chloride, sodium chloride, sodium chloride, sodium chloride, acetaminophen **OR** acetaminophen, ALPRAZolam, alteplase, bisacodyl, cloNIDine, heparin, HYDROcodone-acetaminophen, lidocaine-prilocaine, ondansetron **OR** ondansetron (ZOFRAN) IV, pentafluoroprop-tetrafluoroeth, traZODone  Assessment/ Plan:  82 y.o. female with a PMHx of renal cell carcinoma status post left nephrectomy (08/2000) and partial right nephrectomy (06/2002), hypertension,  hyperlipidemia, anemia of chronic kidney disease, secondary hyperparathyroidism, osteoporosis, history of breast cancer, who was admitted to [facility] on 02/05/2017 for evaluation of significant shortness of breath.    1.  Acute renal failure due to hypontension, dehydration. 2.  CKD stage IV baseline Cr 3.0, CKD secondary to left nephrectomy and partial right nephrectomy. 3.  Anemia of CKD.   4.  Secondary hyperparathyroidism. 5.  Bacterial pneumonia. 6.  1.8 cm Right renal  mass.  7.  Acute respiratory failure ?pulmonary edema  Plan:  Patient underwent HD on Monday 2/18; No fluid removed with HD due to intradialytic hypotension Monitor UOP and resp status; monitor renal panel  24-hour urine for creatinine clearance results are pending.  Will attempt IHD today and try to remove fluid as tolerated    LOS: 5 Shanti Eichel 2/20/201910:26 AM

## 2017-02-24 NOTE — Progress Notes (Signed)
Spoke with Dr Candiss Norse regarding elevated bp and large dose metoprolol due at 10am. During prior HD, pt had problems with low bp. Med changes made by Dr Candiss Norse. Clonidine given.

## 2017-02-24 NOTE — Progress Notes (Signed)
HD tx start. Tx delayed r/t access clotting. MD made aware.

## 2017-02-24 NOTE — Progress Notes (Signed)
To HD per Bed. Report was given to HD RN. Pt drowsy but arouses very easily. Tolerated bath and linen change with only brief drop in sats. VSS.

## 2017-02-24 NOTE — Progress Notes (Signed)
No significant changes overall.  Patient remains on nasal cannula oxygen at 6 LPM.  Dialysis scheduled today.  We will recheck CXR in a.m. to see if there is clearing of pulmonary infiltrates after volume removal.  No charge for this encounter  Merton Border, MD PCCM service Mobile [phone] Pager [phone] 02/24/2017 1:09 PM

## 2017-02-24 NOTE — Progress Notes (Signed)
Return to ICU 19. Pain relief with norco. Sats 98% on 6 L/[place]. Reported 641ml UF before catheter clotted off. VSS. Naps but arouses easily.

## 2017-02-24 NOTE — Progress Notes (Signed)
Post HD assessment. Net UF 623, pt unable to be rinsed back. System clotted, MD made aware. Pt had an anxiety attack during tx, ICU RN and MD made aware.  RN at bedside, meds given. RN explained that while on the floor pt had a similar episode.  Once pt was reassured and reassessed, pt stabilized. RN at bedside when system clotted, RN transported pt back to her room.

## 2017-02-24 NOTE — Progress Notes (Signed)
Pt very tired after dialysis. No appetite or energy to eat. Prefers to sleep. She does arouse easily to answer questions. Husband stays at bedside. Both appear sad. O2 decreased to 4L/[place] with good sats when.not snoring. She has no further c/o backpain. Husband states pt is frequently bothered by back pain. Bed rotation has been on. She shifts from side to side also.  CXR ordered for am.

## 2017-02-24 NOTE — Progress Notes (Signed)
Patient ID: Erin Mccormick, female   DOB: 01/08/1935, 82 y.o.   MRN: [id number]  [facility] PROGRESS NOTE  AILY Mccormick [id number]:[id number] DOB: 04/19/1935 DOA: 03/04/2017 PCP: Dion Body, MD  HPI/Subjective: Feels tired, finished HD  Objective: Vitals:   02/24/17 1930 02/24/17 2050  BP:    Pulse:    Resp:    Temp: 98.2 F (36.8 C)   SpO2:  94%    Filed Weights   02/23/17 0500 02/24/17 1200 02/24/17 1400  Weight: 54.5 kg (120 lb 2.4 oz) 55.7 kg (122 lb 12.7 oz) 55.1 kg (121 lb 7.6 oz)    ROS: Review of Systems  Constitutional: Negative for chills and fever.  Eyes: Negative for blurred vision.  Respiratory: Positive for cough and shortness of breath.   Cardiovascular: Negative for chest pain.  Gastrointestinal: Negative for abdominal pain, constipation, diarrhea, nausea and vomiting.  Genitourinary: Negative for dysuria.  Musculoskeletal: Negative for joint pain.  Neurological: Negative for dizziness and headaches.   Exam: Physical Exam  Constitutional: She is oriented to person, place, and time.  HENT:  Nose: No mucosal edema.  Mouth/Throat: No oropharyngeal exudate or posterior oropharyngeal edema.  Eyes: Conjunctivae, EOM and lids are normal. Pupils are equal, round, and reactive to light.  Neck: No JVD present. Carotid bruit is not present. No edema present. No thyroid mass and no thyromegaly present.  Cardiovascular: S1 normal and S2 normal. Exam reveals no gallop.  No murmur heard. Pulses:      Dorsalis pedis pulses are 2+ on the right side, and 2+ on the left side.  Respiratory: No respiratory distress. She has decreased breath sounds in the right middle field, the right lower field and the left lower field. She has no wheezes. She has rhonchi in the right lower field and the left lower field. She has no rales.  GI: Soft. Bowel sounds are normal. There is no tenderness.  Musculoskeletal:       Right ankle: She exhibits no swelling.       Left ankle: She  exhibits no swelling.  Lymphadenopathy:    She has no cervical adenopathy.  Neurological: She is alert and oriented to person, place, and time. No cranial nerve deficit.  Skin: Skin is warm. No rash noted. Nails show no clubbing.  Psychiatric: She has a normal mood and affect.      Data Reviewed: Basic Metabolic Panel: Recent Labs  Lab 02/19/17 0330 02/19/17 1218 02/20/17 0541 02/21/17 0540 02/21/17 1400 02/22/17 0450 02/23/17 0540 02/24/17 0606  NA 136  --  138 137  --  140 138 141  K 4.2  --  4.8 5.0  --  5.1 5.1 5.2*  CL 101  --  105 105  --  108 106 108  CO2 23  --  21* 20*  --  21* 24 26  GLUCOSE 123*  --  181* 204*  --  220* 193* 128*  BUN 68*  --  82* 93*  --  99* 90* 89*  CREATININE 3.61*  --  3.54* 3.60*  --  3.24* 2.99* 3.00*  CALCIUM 8.8*  --  8.4* 8.4*  --  8.5* 8.5* 8.8*  MG 1.7  --   --  2.1  --   --   --   --   PHOS 4.4 5.4* 5.7* 5.2* 4.6 4.5  --   --    Liver Function Tests: Recent Labs  Lab 02/20/17 0541 02/21/17 0540 02/22/17 0450  ALBUMIN 2.1* 2.3*  2.2*   CBC: Recent Labs  Lab 02/19/17 0330 02/21/17 0540 02/22/17 0450 02/23/17 0540 02/24/17 0606  WBC 7.8 11.3* 8.6 10.0 13.5*  NEUTROABS  --   --  8.0* 9.0* 12.4*  HGB 9.0* 9.4* 9.0* 9.3* 9.2*  HCT 26.3* 28.1* 27.3* 27.9* 28.4*  MCV 85.9 86.9 86.5 87.3 87.2  PLT 424 558* 436 401 375     CBG: Recent Labs  Lab 02/19/17 0211 02/20/17 0734 02/21/17 0731 02/22/17 0719 02/23/17 0757  GLUCAP 124* 181* 199* 196* 165*    Recent Results (from the past 240 hour(s))  Blood culture (routine x 2)     Status: None   Collection Time: 02/11/2017  9:29 PM  Result Value Ref Range Status   Specimen Description BLOOD BLOOD RIGHT FOREARM  Final   Special Requests   Final    BOTTLES DRAWN AEROBIC AND ANAEROBIC Blood Culture adequate volume   Culture   Final    NO GROWTH 5 DAYS Performed at [facility], [address] [place], [place] [address]    Report Status 02/23/2017 FINAL  Final   MRSA PCR Screening     Status: None   Collection Time: 02/19/17  2:13 AM  Result Value Ref Range Status   MRSA by PCR NEGATIVE NEGATIVE Final    Comment:        The GeneXpert MRSA Assay (FDA approved for NASAL specimens only), is one component of a comprehensive MRSA colonization surveillance program. It is not intended to diagnose MRSA infection nor to guide or monitor treatment for MRSA infections. Performed at [facility], [address] [place], [place] [address]   Blood culture (routine x 2)     Status: None   Collection Time: 02/19/17  3:30 AM  Result Value Ref Range Status   Specimen Description BLOOD RIGHT ANTECUBITAL  Final   Special Requests   Final    BOTTLES DRAWN AEROBIC AND ANAEROBIC Blood Culture adequate volume   Culture   Final    NO GROWTH 5 DAYS Performed at [facility], [address], [place] [address], [place] [address]    Report Status 02/24/2017 FINAL  Final     Studies: No results found.  Scheduled Meds: . calcium acetate  667 mg Oral TID WC  . docusate sodium  100 mg Oral BID  . ferrous sulfate  325 mg Oral Q breakfast  . heparin  5,000 Units Subcutaneous Q8H  . ipratropium-albuterol  3 mL Nebulization Q6H  . levothyroxine  88 mcg Oral QAC breakfast  . lidocaine  10 mL Intradermal Once  . metoprolol  100 mg Oral QHS  . PARoxetine  10 mg Oral Daily  . pravastatin  20 mg Oral Daily   Continuous Infusions:   Assessment/Plan:   1. Acute hypoxic respiratory failure.  Patient on 4 L nasal cannula. 2. Bilateral pneumonia.  Rocephin and Solu-Medrol stopped by critical care specialist. CXR in am 3. Acute kidney disease on chronic kidney disease stage IV.  Nephrology started dialysis  Patient history of renal cell cancer status post left nephrectomy and partial nephrectomy on right 4. Acute diastolic congestive heart failure.  Dialysis to help out with fluid removal 5. Right renal mass. 6. Hypothyroidism unspecified on  levothyroxine 7. Anxiety depression on Paxil 8. Hyperlipidemia unspecified on pravastatin 9. Essential hypertension on metoprolol 10. History of breast cancer on anastrozole  Code Status:     Code Status Orders  (From admission, onward)  Start     Ordered   02/19/17 0226  Full code  Continuous     02/19/17 0225    Code Status History    Date Active Date Inactive Code Status Order ID Comments User Context   07/01/2016 08:53 07/02/2016 14:29 Full Code [id number]  Saundra Shelling, MD Inpatient    Advance Directive Documentation     Most Recent Value  Type of Advance Directive  Living will  Pre-existing out of facility DNR order (yellow form or pink MOST form)  No data  "MOST" Form in Place?  No data     Family Communication: none today Disposition Plan: To be determined  Consultants:  Critical care specialist  Nephrologist  Antibiotics:  none  Time spent: 15 minutes  Maurice Fotheringham [employer]

## 2017-02-24 NOTE — Progress Notes (Signed)
Called to HD unit to see pt. She c/o of pain in sacral area and wanting pain med. Norco  one tab given. While enroute to HD unit, patients sats dropped temporarily and required 100% NRB. BP also dropped briefly when I arrived. Both are improved now and pt in no acute distress. I will stay I bedside UF paused by HD RN.

## 2017-02-25 ENCOUNTER — Inpatient Hospital Stay: Payer: Medicare Other

## 2017-02-25 DIAGNOSIS — E440 Moderate protein-calorie malnutrition: Secondary | ICD-10-CM

## 2017-02-25 LAB — CBC
HEMATOCRIT: 22.7 % — AB (ref 35.0–47.0)
HEMOGLOBIN: 7.4 g/dL — AB (ref 12.0–16.0)
MCH: 28.3 pg (ref 26.0–34.0)
MCHC: 32.6 g/dL (ref 32.0–36.0)
MCV: 86.9 fL (ref 80.0–100.0)
Platelets: 255 10*3/uL (ref 150–440)
RBC: 2.62 MIL/uL — ABNORMAL LOW (ref 3.80–5.20)
RDW: 14.4 % (ref 11.5–14.5)
WBC: 20 10*3/uL — ABNORMAL HIGH (ref 3.6–11.0)

## 2017-02-25 LAB — BASIC METABOLIC PANEL
Anion gap: 12 (ref 5–15)
BUN: 78 mg/dL — AB (ref 6–20)
CHLORIDE: 104 mmol/L (ref 101–111)
CO2: 23 mmol/L (ref 22–32)
Calcium: 8.4 mg/dL — ABNORMAL LOW (ref 8.9–10.3)
Creatinine, Ser: 2.94 mg/dL — ABNORMAL HIGH (ref 0.44–1.00)
GFR calc Af Amer: 16 mL/min — ABNORMAL LOW (ref >60)
GFR calc non Af Amer: 14 mL/min — ABNORMAL LOW (ref >60)
Glucose, Bld: 116 mg/dL — ABNORMAL HIGH (ref 65–99)
Potassium: 5.1 mmol/L (ref 3.5–5.1)
Sodium: 139 mmol/L (ref 135–145)

## 2017-02-25 LAB — MAGNESIUM: Magnesium: 2.1 mg/dL (ref 1.7–2.4)

## 2017-02-25 LAB — BRAIN NATRIURETIC PEPTIDE: B Natriuretic Peptide: 423 pg/mL — ABNORMAL HIGH (ref 0.0–100.0)

## 2017-02-25 LAB — PROCALCITONIN: Procalcitonin: 1.58 ng/mL

## 2017-02-25 LAB — PHOSPHORUS: Phosphorus: 4.8 mg/dL — ABNORMAL HIGH (ref 2.5–4.6)

## 2017-02-25 LAB — SEDIMENTATION RATE: Sed Rate: 126 mm/hr — ABNORMAL HIGH (ref 0–30)

## 2017-02-25 LAB — GLUCOSE, CAPILLARY: Glucose-Capillary: 111 mg/dL — ABNORMAL HIGH (ref 65–99)

## 2017-02-25 MED ORDER — RENA-VITE PO TABS
1.0000 | ORAL_TABLET | Freq: Every day | ORAL | Status: DC
  Administered 2017-02-25: 1 via ORAL
  Filled 2017-02-25: qty 1

## 2017-02-25 MED ORDER — NEPRO/CARBSTEADY PO LIQD
237.0000 mL | Freq: Three times a day (TID) | ORAL | Status: DC
  Administered 2017-02-25 (×2): 237 mL via ORAL

## 2017-02-25 MED ORDER — IPRATROPIUM-ALBUTEROL 0.5-2.5 (3) MG/3ML IN SOLN
3.0000 mL | RESPIRATORY_TRACT | Status: DC | PRN
  Administered 2017-02-25: 3 mL via RESPIRATORY_TRACT
  Filled 2017-02-25 (×2): qty 3

## 2017-02-25 MED ORDER — TUBERCULIN PPD 5 UNIT/0.1ML ID SOLN
5.0000 [IU] | Freq: Once | INTRADERMAL | Status: AC
  Administered 2017-02-25: 5 [IU] via INTRADERMAL
  Filled 2017-02-25: qty 0.1

## 2017-02-25 NOTE — Progress Notes (Signed)
Xanax0.25mg  prior to dialysis transfer per pt request. Transferred to HD unit with HFNC set up for her arrival. Sats 93% on 6L/[place] during transfer. Report given to dialysis RN.

## 2017-02-25 NOTE — Progress Notes (Signed)
Patient ID: SHRESTA RISDEN, female   DOB: February 08, 1935, 82 y.o.   MRN: [id number]  [facility] PROGRESS NOTE  JI FAIRBURN YQM:[id number] DOB: [birth year] DOA: 02/17/2017 PCP: Dion Body, MD  HPI/Subjective: Awake, some issues with dialysis access.   Objective: Vitals:   02/25/17 1600 02/25/17 1630  BP: (!) 132/44   Pulse: 69   Resp: (!) 21   Temp:  98 F (36.7 C)  SpO2: 98%     Filed Weights   02/23/17 0500 02/24/17 1200 02/24/17 1400  Weight: 54.5 kg (120 lb 2.4 oz) 55.7 kg (122 lb 12.7 oz) 55.1 kg (121 lb 7.6 oz)    ROS: Review of Systems  Constitutional: Negative for chills and fever.  Eyes: Negative for blurred vision.  Respiratory: Positive for cough and shortness of breath.   Cardiovascular: Negative for chest pain.  Gastrointestinal: Negative for abdominal pain, constipation, diarrhea, nausea and vomiting.  Genitourinary: Negative for dysuria.  Musculoskeletal: Negative for joint pain.  Neurological: Negative for dizziness and headaches.   Exam: Physical Exam  Constitutional: She is oriented to person, place, and time.  HENT:  Nose: No mucosal edema.  Mouth/Throat: No oropharyngeal exudate or posterior oropharyngeal edema.  Eyes: Conjunctivae, EOM and lids are normal. Pupils are equal, round, and reactive to light.  Neck: No JVD present. Carotid bruit is not present. No edema present. No thyroid mass and no thyromegaly present.  Cardiovascular: S1 normal and S2 normal. Exam reveals no gallop.  No murmur heard. Pulses:      Dorsalis pedis pulses are 2+ on the right side, and 2+ on the left side.  Respiratory: No respiratory distress. She has decreased breath sounds in the right middle field, the right lower field and the left lower field. She has no wheezes. She has rhonchi in the right lower field and the left lower field. She has no rales.  GI: Soft. Bowel sounds are normal. There is no tenderness.  Musculoskeletal:       Right ankle: She exhibits no  swelling.       Left ankle: She exhibits no swelling.  Lymphadenopathy:    She has no cervical adenopathy.  Neurological: She is alert and oriented to person, place, and time. No cranial nerve deficit.  Skin: Skin is warm. No rash noted. Nails show no clubbing.  Psychiatric: She has a normal mood and affect.      Data Reviewed: Basic Metabolic Panel: Recent Labs  Lab 02/19/17 0330  02/20/17 0541 02/21/17 0540 02/21/17 1400 02/22/17 0450 02/23/17 0540 02/24/17 0606 02/25/17 0518  NA 136  --  138 137  --  140 138 141 139  K 4.2  --  4.8 5.0  --  5.1 5.1 5.2* 5.1  CL 101  --  105 105  --  108 106 108 104  CO2 23  --  21* 20*  --  21* 24 26 23   GLUCOSE 123*  --  181* 204*  --  220* 193* 128* 116*  BUN 68*  --  82* 93*  --  99* 90* 89* 78*  CREATININE 3.61*  --  3.54* 3.60*  --  3.24* 2.99* 3.00* 2.94*  CALCIUM 8.8*  --  8.4* 8.4*  --  8.5* 8.5* 8.8* 8.4*  MG 1.7  --   --  2.1  --   --   --   --  2.1  PHOS 4.4   < > 5.7* 5.2* 4.6 4.5  --   --  4.8*   < > =  values in this interval not displayed.   Liver Function Tests: Recent Labs  Lab 02/20/17 0541 02/21/17 0540 02/22/17 0450  ALBUMIN 2.1* 2.3* 2.2*   CBC: Recent Labs  Lab 02/21/17 0540 02/22/17 0450 02/23/17 0540 02/24/17 0606 02/25/17 0518  WBC 11.3* 8.6 10.0 13.5* 20.0*  NEUTROABS  --  8.0* 9.0* 12.4*  --   HGB 9.4* 9.0* 9.3* 9.2* 7.4*  HCT 28.1* 27.3* 27.9* 28.4* 22.7*  MCV 86.9 86.5 87.3 87.2 86.9  PLT 558* 436 401 375 255     CBG: Recent Labs  Lab 02/20/17 0734 02/21/17 0731 02/22/17 0719 02/23/17 0757 02/25/17 0741  GLUCAP 181* 199* 196* 165* 111*    Recent Results (from the past 240 hour(s))  Blood culture (routine x 2)     Status: None   Collection Time: 02/22/2017  9:29 PM  Result Value Ref Range Status   Specimen Description BLOOD BLOOD RIGHT FOREARM  Final   Special Requests   Final    BOTTLES DRAWN AEROBIC AND ANAEROBIC Blood Culture adequate volume   Culture   Final    NO GROWTH 5  DAYS Performed at [facility], [address] [place], [place] [address]    Report Status 02/23/2017 FINAL  Final  MRSA PCR Screening     Status: None   Collection Time: 02/19/17  2:13 AM  Result Value Ref Range Status   MRSA by PCR NEGATIVE NEGATIVE Final    Comment:        The GeneXpert MRSA Assay (FDA approved for NASAL specimens only), is one component of a comprehensive MRSA colonization surveillance program. It is not intended to diagnose MRSA infection nor to guide or monitor treatment for MRSA infections. Performed at [facility], [address] [place], [place] [address]   Blood culture (routine x 2)     Status: None   Collection Time: 02/19/17  3:30 AM  Result Value Ref Range Status   Specimen Description BLOOD RIGHT ANTECUBITAL  Final   Special Requests   Final    BOTTLES DRAWN AEROBIC AND ANAEROBIC Blood Culture adequate volume   Culture   Final    NO GROWTH 5 DAYS Performed at [facility], [address], [place], [place] [address]    Report Status 02/24/2017 FINAL  Final     Studies: Dg Chest Port 1 View  Result Date: 02/25/2017 CLINICAL DATA:  Respiratory failure. EXAM: PORTABLE CHEST 1 VIEW COMPARISON:  Chest x-ray 02/22/2017. Chest x-ray 02/21/2017. Chest CT 02/16/2017. FINDINGS: Mediastinum is normal. Heart size stable. Diffuse bilateral pulmonary infiltrates/edema again noted. No significant interim change. No pleural effusion or pneumothorax. IMPRESSION: Diffuse bilateral pulmonary infiltrates/edema again noted. No significant interim change. Electronically Signed   By: Marcello Moores  Register   On: 02/25/2017 06:46    Scheduled Meds: . calcium acetate  667 mg Oral TID WC  . docusate sodium  100 mg Oral BID  . feeding supplement (NEPRO CARB STEADY)  237 mL Oral TID BM  . ferrous sulfate  325 mg Oral Q breakfast  . heparin  5,000 Units Subcutaneous Q8H  . levothyroxine  88 mcg Oral QAC breakfast  . lidocaine  10 mL  Intradermal Once  . metoprolol  100 mg Oral QHS  . multivitamin  1 tablet Oral QHS  . PARoxetine  10 mg Oral Daily  . pravastatin  20 mg Oral Daily  . tuberculin  5 Units Intradermal Once   Continuous Infusions:   Assessment/Plan:   1. Acute  hypoxic respiratory failure.  Patient on 6 L nasal cannula. 2. Bilateral pneumonia.  Rocephin and Solu-Medrol stopped by critical care specialist.  3. Acute kidney disease on chronic kidney disease stage IV.  Nephrology started dialysis, perm cath planned for tomorrow.  Patient history of renal cell cancer status post left nephrectomy and partial nephrectomy on right 4. Acute diastolic congestive heart failure.  Dialysis to help out with fluid removal 5. Right renal mass. 6. Hypothyroidism unspecified on levothyroxine 7. Anxiety depression on Paxil 8. Hyperlipidemia unspecified on pravastatin 9. Essential hypertension on metoprolol 10. History of breast cancer on anastrozole  Code Status:     Code Status Orders  (From admission, onward)        Start     Ordered   02/19/17 0226  Full code  Continuous     02/19/17 0225    Code Status History    Date Active Date Inactive Code Status Order ID Comments User Context   07/01/2016 08:53 07/02/2016 14:29 Full Code [id number]  Saundra Shelling, MD Inpatient    Advance Directive Documentation     Most Recent Value  Type of Advance Directive  Living will  Pre-existing out of facility DNR order (yellow form or pink MOST form)  No data  "MOST" Form in Place?  No data     Family Communication: husband at bedside Disposition Plan: To be determined  Consultants:  Critical care specialist  Nephrologist  Antibiotics:  none  Time spent: 15 minutes  Shawnda Mauney [employer]

## 2017-02-25 NOTE — Progress Notes (Signed)
HD started. 

## 2017-02-25 NOTE — Progress Notes (Signed)
Pt. With SpO2 84% on O2 via 6LPM Nasal Cannula. High Flow Nasal Cannula initiated at 45LPM and FiO2 60% with improvement in SpO2 to 93%. Pt. Resting quietly in no distress. Will continue to monitor.

## 2017-02-25 NOTE — Progress Notes (Addendum)
HD completed. Reported access clotted off. UF reported approx 340 ml. Returned to [employer] VSS.

## 2017-02-25 NOTE — Progress Notes (Signed)
[facility]  ROUNDING NOTE   Subjective:  MRI of the abdomen was performed this admission and did show a 1.8 cm right renal mass. However it could not be fully characterize given the lack of contrast. She would likely need urology follow-up.  24 hr CrCl is 12 cc/min Discussed with patient and h er husband re: need for ongoing dialysis They have agreed Still SOB; CXR with pulm congestion; requiring HFNC    Objective:  Vital signs in last 24 hours:  Temp:  [97.4 F (36.3 C)-98.5 F (36.9 C)] 98.5 F (36.9 C) (02/21 0743) Pulse Rate:  [49-80] 68 (02/21 0743) Resp:  [17-32] 30 (02/21 0743) BP: (65-186)/(49-124) 152/85 (02/21 0700) SpO2:  [83 %-100 %] 97 % (02/21 0743) FiO2 (%):  [60 %] 60 % (02/21 0743) Weight:  [55.1 kg (121 lb 7.6 oz)-55.7 kg (122 lb 12.7 oz)] 55.1 kg (121 lb 7.6 oz) (02/20 1400)  Weight change:  Filed Weights   02/23/17 0500 02/24/17 1200 02/24/17 1400  Weight: 54.5 kg (120 lb 2.4 oz) 55.7 kg (122 lb 12.7 oz) 55.1 kg (121 lb 7.6 oz)    Intake/Output: I/O last 3 completed shifts: In: 600 [P.O.:600] Out: 2298 [Urine:1675; Other:623]   Intake/Output this shift:  No intake/output data recorded.  Physical Exam: General: No acute distress  Head: Normocephalic, atraumatic. Moist oral mucosal membranes  Eyes: Anicteric  Neck: Supple, trachea midline  Lungs:  B/l rhonchi and crackles, normal effort,    Heart: S1S2 no rubs  Abdomen:  Soft, nontender, bowel sounds present  Extremities: + peripheral edema.  Neurologic: Awake, alert, following commands  Skin: No lesions   Femoral dialysis cathter    Basic Metabolic Panel: Recent Labs  Lab 02/19/17 0330  02/20/17 0541 02/21/17 0540 02/21/17 1400 02/22/17 0450 02/23/17 0540 02/24/17 0606 02/25/17 0518  NA 136  --  138 137  --  140 138 141 139  K 4.2  --  4.8 5.0  --  5.1 5.1 5.2* 5.1  CL 101  --  105 105  --  108 106 108 104  CO2 23  --  21* 20*  --  21* 24 26 23   GLUCOSE 123*  --   181* 204*  --  220* 193* 128* 116*  BUN 68*  --  82* 93*  --  99* 90* 89* 78*  CREATININE 3.61*  --  3.54* 3.60*  --  3.24* 2.99* 3.00* 2.94*  CALCIUM 8.8*  --  8.4* 8.4*  --  8.5* 8.5* 8.8* 8.4*  MG 1.7  --   --  2.1  --   --   --   --  2.1  PHOS 4.4   < > 5.7* 5.2* 4.6 4.5  --   --  4.8*   < > = values in this interval not displayed.    Liver Function Tests: Recent Labs  Lab 02/20/17 0541 02/21/17 0540 02/22/17 0450  ALBUMIN 2.1* 2.3* 2.2*   No results for input(s): LIPASE, AMYLASE in the last 168 hours. No results for input(s): AMMONIA in the last 168 hours.  CBC: Recent Labs  Lab 02/21/17 0540 02/22/17 0450 02/23/17 0540 02/24/17 0606 02/25/17 0518  WBC 11.3* 8.6 10.0 13.5* 20.0*  NEUTROABS  --  8.0* 9.0* 12.4*  --   HGB 9.4* 9.0* 9.3* 9.2* 7.4*  HCT 28.1* 27.3* 27.9* 28.4* 22.7*  MCV 86.9 86.5 87.3 87.2 86.9  PLT 558* 436 401 375 255    Cardiac Enzymes: Recent Labs  Lab 02/28/2017 2129  TROPONINI <0.03    BNP: Invalid input(s): POCBNP  CBG: Recent Labs  Lab 02/20/17 0734 02/21/17 0731 02/22/17 0719 02/23/17 0757 02/25/17 0741  GLUCAP 181* 199* 196* 165* 111*    Microbiology: Results for orders placed or performed during the hospital encounter of 02/23/2017  Blood culture (routine x 2)     Status: None   Collection Time: 02/22/2017  9:29 PM  Result Value Ref Range Status   Specimen Description BLOOD BLOOD RIGHT FOREARM  Final   Special Requests   Final    BOTTLES DRAWN AEROBIC AND ANAEROBIC Blood Culture adequate volume   Culture   Final    NO GROWTH 5 DAYS Performed at [facility], [address] [place], [place] [address]    Report Status 02/23/2017 FINAL  Final  MRSA PCR Screening     Status: None   Collection Time: 02/19/17  2:13 AM  Result Value Ref Range Status   MRSA by PCR NEGATIVE NEGATIVE Final    Comment:        The GeneXpert MRSA Assay (FDA approved for NASAL specimens only), is one component of a comprehensive MRSA  colonization surveillance program. It is not intended to diagnose MRSA infection nor to guide or monitor treatment for MRSA infections. Performed at [facility], [address] [place], [place] [address]   Blood culture (routine x 2)     Status: None   Collection Time: 02/19/17  3:30 AM  Result Value Ref Range Status   Specimen Description BLOOD RIGHT ANTECUBITAL  Final   Special Requests   Final    BOTTLES DRAWN AEROBIC AND ANAEROBIC Blood Culture adequate volume   Culture   Final    NO GROWTH 5 DAYS Performed at [facility], [address], [place], [place] [address]    Report Status 02/24/2017 FINAL  Final    Coagulation Studies: No results for input(s): LABPROT, INR in the last 72 hours.  Urinalysis: No results for input(s): COLORURINE, LABSPEC, PHURINE, GLUCOSEU, HGBUR, BILIRUBINUR, KETONESUR, PROTEINUR, UROBILINOGEN, NITRITE, LEUKOCYTESUR in the last 72 hours.  Invalid input(s): APPERANCEUR    Imaging: Dg Chest Port 1 View  Result Date: 02/25/2017 CLINICAL DATA:  Respiratory failure. EXAM: PORTABLE CHEST 1 VIEW COMPARISON:  Chest x-ray 02/22/2017. Chest x-ray 02/21/2017. Chest CT 02/16/2017. FINDINGS: Mediastinum is normal. Heart size stable. Diffuse bilateral pulmonary infiltrates/edema again noted. No significant interim change. No pleural effusion or pneumothorax. IMPRESSION: Diffuse bilateral pulmonary infiltrates/edema again noted. No significant interim change. Electronically Signed   By: Marcello Moores  Register   On: 02/25/2017 06:46     Medications:    . calcium acetate  667 mg Oral TID WC  . docusate sodium  100 mg Oral BID  . ferrous sulfate  325 mg Oral Q breakfast  . heparin  5,000 Units Subcutaneous Q8H  . ipratropium-albuterol  3 mL Nebulization Q6H  . levothyroxine  88 mcg Oral QAC breakfast  . lidocaine  10 mL Intradermal Once  . metoprolol  100 mg Oral QHS  . PARoxetine  10 mg Oral Daily  . pravastatin  20 mg Oral Daily    acetaminophen **OR** acetaminophen, ALPRAZolam, bisacodyl, cloNIDine, HYDROcodone-acetaminophen, ondansetron **OR** ondansetron (ZOFRAN) IV, traZODone  Assessment/ Plan:  82 y.o. female with a PMHx of renal cell carcinoma status post left nephrectomy (08/2000) and partial right nephrectomy (06/2002), hypertension, hyperlipidemia, anemia of chronic kidney disease, secondary hyperparathyroidism, osteoporosis, history of breast cancer, who was admitted to [facility] on 02/13/2017 for  evaluation of significant shortness of breath.    1.  ESRD 2.  CKD secondary to left nephrectomy and partial right nephrectomy. 3.  Anemia of CKD.   4.  Secondary hyperparathyroidism. 5.  Bacterial pneumonia.,Acute pulmonary edema 6.  1.8 cm Right renal mass.   Plan:  Fluid removal limited due to intradialytic hypotension Will try UF again today Consult vascular for permcath placement tomorrow Start d/c planning for outpatient HD. Patient prefers [facility]     LOS: 6 Hershel Corkery Candiss Norse 2/21/20198:54 AM

## 2017-02-25 NOTE — Progress Notes (Signed)
Pre HD  

## 2017-02-25 NOTE — Progress Notes (Signed)
HD notified RT and myself that pt dropping sats on 60% HFNC. c/o anxiety just prior to desat. RR 33. Sat 78-80%. HFNC increased to 100% per RT. Sats back to 96% with increased fiO2. . BP and HR stable.

## 2017-02-25 NOTE — Progress Notes (Signed)
HD complete 

## 2017-02-25 NOTE — Progress Notes (Signed)
Initial Nutrition Assessment  DOCUMENTATION CODES:   Non-severe (moderate) malnutrition in context of chronic illness  INTERVENTION:  Recommend liberalizing diet to regular. No fluid restriction needed at this time per Dr. Candiss Norse. Phosphorus slightly elevated but patient is on a phosphorus binder. Potassium WNL.  Provide Nepro Shake po TID, each supplement provides 425 kcal and 19 grams protein.  Provide Rena-vite po QHS.  NUTRITION DIAGNOSIS:   Moderate Malnutrition related to chronic illness(hx RCC s/p left nephrectomy and partial right nephrectomy, hx left breast cancer s/p lumpectomy and XRT, CKD) as evidenced by moderate fat depletion, moderate muscle depletion, severe muscle depletion.  GOAL:   Patient will meet greater than or equal to 90% of their needs  MONITOR:   PO intake, Supplement acceptance, Labs, Weight trends, Skin, I & O's  REASON FOR ASSESSMENT:   Rounds    ASSESSMENT:   82 year old female with PMHx of RCC s/p left nephrectomy 08/2000 and partial right nephrectomy 06/2002 resulting in CKD, hx left breast cancer s/p lumpectomy in 2011 and XRT, HTN, anemia of chronic kidney disease, OP, gout, HLD, depression admitted for significant shortness of breath and found to have bilateral PNA, acute diastolic congestive heart failure, and right renal mass.   -Fluid removal has been limited due to intradialytic hypotension. Plan is for HD again today with attempt for UF. -Plan is for permcath placement tomorrow.  Met with patient and her husband at bedside. Patient reports that she had a good appetite PTA. She reports that many years ago after her partial right nephrectomy in 2004 she began having chronic unexplained diarrhea. She reports the diarrhea lasted about 5 years and then stopped. She denied any other surgical hx to this RD, but after assessment noted pt has a hx of cholecystectomy (unsure what year) which can lead to diarrhea. Nonetheless the diarrhea stopped about  10 years ago and now she has normal bowel movements and occasionally needs a stool softener. She typically eats 3 meals per day. She has been very careful with her diet to prevent needing to go on dialysis after her nephrectomies and was able to go 15 years without dialysis. She eats a small breakfast (biscuit or muffin). Lunch and dinner are usually grilled chicken or pork chop with low-potassium vegetables. She has also avoided high-phosphorus foods. She reports that since admission she has had a very poor appetite and is only eating bites now. She has been having a few bites of a muffin for breakfast, missing lunch because of HD, and then eating very small amounts at dinner, too. Discussed recommendation for liberalization of diet at this time. Also discussed addition of Nepro to help meet calorie/protein needs.  UBW had been 132 lbs prior to her onset of diarrhea many years ago. She then lost down to about 116 lbs. Patient reports she has been gaining weight lately. Per chart she was down to 113.25 lbs on 10/01/2016 and has since gained weight. Current weight may be slightly elevated with fluid, but no edema found on NFPE.  Meal Completion: 15-50%  Medications reviewed and include: calcium acetate 667 mg TID with meals, Colace, ferrous sulfate 325 mg daily, levothyroxine, Xanax PRN.  Labs reviewed: CBG 111, BUN 78, Creatinine 2.94, Phosphorus 4.8. Potassium WNL at 5.1.  I/O: 875 mL UOP yesterday (0.7 mL/kg/hr)  Discussed with Dr. Candiss Norse. Okay to liberalize diet to regular. Patient does not need a fluid restriction at this time. Discussed with RN and on rounds. Plan is to liberalize diet.  NUTRITION - FOCUSED PHYSICAL EXAM:    Most Recent Value  Orbital Region  Moderate depletion  Upper Arm Region  Severe depletion  Thoracic and Lumbar Region  Moderate depletion  Buccal Region  Moderate depletion  Temple Region  Severe depletion  Clavicle Bone Region  Moderate depletion  Clavicle and Acromion  Bone Region  Moderate depletion  Scapular Bone Region  Moderate depletion  Dorsal Hand  Severe depletion  Patellar Region  Moderate depletion  Anterior Thigh Region  Moderate depletion  Posterior Calf Region  Severe depletion  Edema (RD Assessment)  None  Hair  Reviewed  Eyes  Reviewed  Mouth  Reviewed  Skin  Reviewed  Nails  Reviewed     Diet Order:  Diet regular Room service appropriate? Yes; Fluid consistency: Thin  EDUCATION NEEDS:   Education needs have been addressed  Skin:  Skin Assessment: Reviewed RN Assessment  Last BM:  02/23/2017 - medium type 3  Height:   Ht Readings from Last 1 Encounters:  02/07/2017 5' 5"  (1.651 m)    Weight:   Wt Readings from Last 1 Encounters:  02/24/17 121 lb 7.6 oz (55.1 kg)    Ideal Body Weight:  56.8 kg  BMI:  Body mass index is 20.21 kg/m.  Estimated Nutritional Needs:   Kcal:  1650-1820 (30-33 kcal/kg)  Protein:  70-85 grams (1.3-1.5 grams/kg)  Fluid:  UOP + 1L  Willey Blade, MS, RD, LDN Office: [phone] Pager: [phone] After Hours/Weekend Pager: [phone]

## 2017-02-25 NOTE — Progress Notes (Signed)
She had difficulty with dialysis yesterday.  She developed panic and there were also problems with dialysis access.  Ultrafiltration was less than 650 cc.  Overnight, she had worsening hypoxemia and is presently on HFNC at 45%.  She denies chest pain and dyspnea.  Her cognition is intact.  Vitals:   02/25/17 0743 02/25/17 0800 02/25/17 0900 02/25/17 1054  BP:  (!) 172/57 (!) 170/66   Pulse: 68 70 77 73  Resp: (!) 30 (!) 30 (!) 32 (!) 33  Temp: 98.5 F (36.9 C)     TempSrc: Oral     SpO2: 97% 95% 97% 93%  Weight:      Height:         Gen: Elderly, frail in NAD HEENT: NCAT, sclerae white, oropharynx normal Neck: No LAN, no JVD noted Lungs: full BS, faint bibasilar crackles, no wheezes Cardiovascular: Regular, no M noted Abdomen: Soft, NT, +BS Ext: no C/C/E Neuro: No focal deficits Skin: No lesions noted   BMP Latest Ref Rng & Units 02/25/2017 02/24/2017 02/23/2017  Glucose 65 - 99 mg/dL 116(H) 128(H) 193(H)  BUN 6 - 20 mg/dL 78(H) 89(H) 90(H)  Creatinine 0.44 - 1.00 mg/dL 2.94(H) 3.00(H) 2.99(H)  Sodium 135 - 145 mmol/L 139 141 138  Potassium 3.5 - 5.1 mmol/L 5.1 5.2(H) 5.1  Chloride 101 - 111 mmol/L 104 108 106  CO2 22 - 32 mmol/L 23 26 24   Calcium 8.9 - 10.3 mg/dL 8.4(L) 8.8(L) 8.5(L)    CBC Latest Ref Rng & Units 02/25/2017 02/24/2017 02/23/2017  WBC 3.6 - 11.0 K/uL 20.0(H) 13.5(H) 10.0  Hemoglobin 12.0 - 16.0 g/dL 7.4(L) 9.2(L) 9.3(L)  Hematocrit 35.0 - 47.0 % 22.7(L) 28.4(L) 27.9(L)  Platelets 150 - 440 K/uL 255 375 401   CXR: Persistent symmetric bilateral infiltrates consistent with edema versus pneumonitis  IMP: Acute hypoxemic respiratory failure -increasing oxygen requirements Bilateral pulmonary infiltrates -edema versus pneumonitis AKI/CKD -has progressed to dialysis dependence   PLAN/REC: She is to be dialyzed again today.  Hopefully we can remove more volume than yesterday.  We should follow chest x-ray for clearance of infiltrates with dialysis.  If they do  clear with HD, then the likely diagnosis is pulmonary edema.  However, she does not have any other evidence of volume overload.  Therefore, I have sent the following diagnostic studies: BNP, PCT, ESR, ANA  Continue supplemental oxygen as needed to maintain SPO2 >90%  She remains a full code but is probably a poor candidate for ACLS or intubation.  We should probably be addressing goals of care  PermCath placement is planned for 2/22  She needs to remain in SDU as long as she is on HFNC  Merton Border, MD PCCM service Mobile [phone] Pager [phone] 02/25/2017 11:21 AM

## 2017-02-26 ENCOUNTER — Encounter: Payer: Self-pay | Admitting: *Deleted

## 2017-02-26 ENCOUNTER — Inpatient Hospital Stay: Admission: EM | Disposition: E | Payer: Self-pay | Source: Home / Self Care | Attending: Internal Medicine

## 2017-02-26 DIAGNOSIS — N186 End stage renal disease: Secondary | ICD-10-CM | POA: Diagnosis not present

## 2017-02-26 HISTORY — PX: DIALYSIS/PERMA CATHETER INSERTION: CATH118288

## 2017-02-26 LAB — PROCALCITONIN: Procalcitonin: 2.59 ng/mL

## 2017-02-26 LAB — CBC WITH DIFFERENTIAL/PLATELET
BASOS ABS: 0 10*3/uL (ref 0–0.1)
BASOS PCT: 0 %
EOS ABS: 0.6 10*3/uL (ref 0–0.7)
Eosinophils Relative: 3 %
HCT: 19.5 % — ABNORMAL LOW (ref 35.0–47.0)
HEMOGLOBIN: 6.3 g/dL — AB (ref 12.0–16.0)
LYMPHS ABS: 0.4 10*3/uL — AB (ref 1.0–3.6)
Lymphocytes Relative: 2 %
MCH: 28.5 pg (ref 26.0–34.0)
MCHC: 32.5 g/dL (ref 32.0–36.0)
MCV: 87.7 fL (ref 80.0–100.0)
Monocytes Absolute: 0.4 10*3/uL (ref 0.2–0.9)
Monocytes Relative: 2 %
NEUTROS PCT: 93 %
Neutro Abs: 17.7 10*3/uL — ABNORMAL HIGH (ref 1.4–6.5)
PLATELETS: 217 10*3/uL (ref 150–440)
RBC: 2.22 MIL/uL — AB (ref 3.80–5.20)
RDW: 14.8 % — ABNORMAL HIGH (ref 11.5–14.5)
WBC: 19 10*3/uL — AB (ref 3.6–11.0)

## 2017-02-26 LAB — PREPARE RBC (CROSSMATCH)

## 2017-02-26 LAB — ABO/RH: ABO/RH(D): A POS

## 2017-02-26 LAB — ANA COMPREHENSIVE PANEL
Centromere Ab Screen: 0.3 AI (ref 0.0–0.9)
Chromatin Ab SerPl-aCnc: <0.2 AI (ref 0.0–0.9)
ENA SM Ab Ser-aCnc: <0.2 AI (ref 0.0–0.9)
Ribonucleic Protein: <0.2 AI (ref 0.0–0.9)
SSB (La) (ENA) Antibody, IgG: <0.2 AI (ref 0.0–0.9)

## 2017-02-26 LAB — BASIC METABOLIC PANEL
ANION GAP: 8 (ref 5–15)
BUN: 80 mg/dL — ABNORMAL HIGH (ref 6–20)
CHLORIDE: 105 mmol/L (ref 101–111)
CO2: 25 mmol/L (ref 22–32)
Calcium: 8.4 mg/dL — ABNORMAL LOW (ref 8.9–10.3)
Creatinine, Ser: 3.02 mg/dL — ABNORMAL HIGH (ref 0.44–1.00)
GFR calc non Af Amer: 14 mL/min — ABNORMAL LOW (ref >60)
GFR, EST AFRICAN AMERICAN: 16 mL/min — AB (ref >60)
Glucose, Bld: 168 mg/dL — ABNORMAL HIGH (ref 65–99)
POTASSIUM: 4.9 mmol/L (ref 3.5–5.1)
SODIUM: 138 mmol/L (ref 135–145)

## 2017-02-26 LAB — GLUCOSE, CAPILLARY: GLUCOSE-CAPILLARY: 150 mg/dL — AB (ref 65–99)

## 2017-02-26 SURGERY — DIALYSIS/PERMA CATHETER INSERTION
Anesthesia: Moderate Sedation

## 2017-02-26 MED ORDER — CEFAZOLIN SODIUM-DEXTROSE 1-4 GM/50ML-% IV SOLN
1.0000 g | Freq: Once | INTRAVENOUS | Status: AC
  Administered 2017-02-26: 1 g via INTRAVENOUS

## 2017-02-26 MED ORDER — LORAZEPAM 2 MG/ML IJ SOLN
INTRAMUSCULAR | Status: AC
  Administered 2017-02-26: 1 mg via INTRAVENOUS
  Filled 2017-02-26: qty 1

## 2017-02-26 MED ORDER — HEPARIN SODIUM (PORCINE) 10000 UNIT/ML IJ SOLN
INTRAMUSCULAR | Status: AC
  Filled 2017-02-26: qty 1

## 2017-02-26 MED ORDER — LORAZEPAM 2 MG/ML IJ SOLN
1.0000 mg | INTRAMUSCULAR | Status: DC | PRN
Start: 2017-02-26 — End: 2017-02-26

## 2017-02-26 MED ORDER — FENTANYL CITRATE (PF) 100 MCG/2ML IJ SOLN
INTRAMUSCULAR | Status: AC
  Filled 2017-02-26: qty 2

## 2017-02-26 MED ORDER — ALPRAZOLAM 0.25 MG PO TABS
0.2500 mg | ORAL_TABLET | Freq: Once | ORAL | Status: AC
  Administered 2017-02-26: 0.25 mg via ORAL

## 2017-02-26 MED ORDER — FENTANYL CITRATE (PF) 100 MCG/2ML IJ SOLN
25.0000 ug | INTRAMUSCULAR | Status: DC | PRN
  Administered 2017-02-27 (×5): 50 ug via INTRAVENOUS
  Administered 2017-02-27: 25 ug via INTRAVENOUS
  Filled 2017-02-26 (×6): qty 2

## 2017-02-26 MED ORDER — CEFAZOLIN SODIUM-DEXTROSE 1-4 GM/50ML-% IV SOLN
INTRAVENOUS | Status: AC
  Filled 2017-02-26: qty 50

## 2017-02-26 MED ORDER — SODIUM CHLORIDE 0.9 % IV SOLN
2.0000 g | INTRAVENOUS | Status: DC
  Administered 2017-02-26: 2 g via INTRAVENOUS
  Filled 2017-02-26 (×2): qty 2

## 2017-02-26 MED ORDER — LIDOCAINE-EPINEPHRINE (PF) 1 %-1:200000 IJ SOLN
INTRAMUSCULAR | Status: AC
  Filled 2017-02-26: qty 30

## 2017-02-26 MED ORDER — LORAZEPAM 2 MG/ML IJ SOLN
1.0000 mg | Freq: Once | INTRAMUSCULAR | Status: AC
  Administered 2017-02-26: 1 mg via INTRAVENOUS

## 2017-02-26 MED ORDER — DEXMEDETOMIDINE HCL IN NACL 400 MCG/100ML IV SOLN
0.4000 ug/kg/h | INTRAVENOUS | Status: DC
  Administered 2017-02-26: 0.4 ug/kg/h via INTRAVENOUS
  Filled 2017-02-26: qty 100

## 2017-02-26 MED ORDER — LIDOCAINE HCL (PF) 1 % IJ SOLN
INTRAMUSCULAR | Status: AC
  Filled 2017-02-26: qty 30

## 2017-02-26 MED ORDER — HEPARIN (PORCINE) IN NACL 2-0.9 UNIT/ML-% IJ SOLN
INTRAMUSCULAR | Status: AC
  Filled 2017-02-26: qty 500

## 2017-02-26 MED ORDER — SODIUM CHLORIDE 0.9 % IV SOLN
1.0000 g | INTRAVENOUS | Status: DC
  Filled 2017-02-26: qty 1

## 2017-02-26 MED ORDER — LORAZEPAM 2 MG/ML IJ SOLN
1.0000 mg | INTRAMUSCULAR | Status: DC | PRN
Start: 2017-02-26 — End: 2017-03-01
  Administered 2017-02-27 – 2017-02-28 (×12): 1 mg via INTRAVENOUS
  Filled 2017-02-26 (×12): qty 1

## 2017-02-26 MED ORDER — SODIUM CHLORIDE 0.9 % IV SOLN
Freq: Once | INTRAVENOUS | Status: AC
  Administered 2017-02-26: 09:00:00 via INTRAVENOUS

## 2017-02-26 MED ORDER — MIDAZOLAM HCL 5 MG/5ML IJ SOLN
INTRAMUSCULAR | Status: AC
Start: 2017-02-26 — End: 2017-02-26
  Filled 2017-02-26: qty 5

## 2017-02-26 MED ORDER — FENTANYL CITRATE (PF) 100 MCG/2ML IJ SOLN
INTRAMUSCULAR | Status: DC | PRN
  Administered 2017-02-26 (×2): 12.5 ug via INTRAVENOUS

## 2017-02-26 MED ORDER — MIDAZOLAM HCL 2 MG/2ML IJ SOLN
INTRAMUSCULAR | Status: DC | PRN
  Administered 2017-02-26: 0.5 mg via INTRAVENOUS

## 2017-02-26 SURGICAL SUPPLY — 5 items
CATH PALINDROME RT-P 15FX19CM (CATHETERS) ×3 IMPLANT
DRAPE INCISE IOBAN 66X45 STRL (DRAPES) ×3 IMPLANT
NEEDLE ENTRY 21GA 7CM ECHOTIP (NEEDLE) ×3 IMPLANT
PACK ANGIOGRAPHY (CUSTOM PROCEDURE TRAY) ×3 IMPLANT
SET INTRO CAPELLA COAXIAL (SET/KITS/TRAYS/PACK) ×3 IMPLANT

## 2017-02-26 NOTE — Progress Notes (Signed)
[facility]  ROUNDING NOTE   Subjective:  MRI of the abdomen was performed this admission and did show a 1.8 cm right renal mass. However it could not be fully characterize given the lack of contrast. She would likely need urology follow-up.  24 hr CrCl is 12 cc/min Discussed with patient and h er husband re: need for ongoing dialysis They have agreed Still SOB; CXR with pulm congestion; requiring HFNC    Objective:  Vital signs in last 24 hours:  Temp:  [97.4 F (36.3 C)-98.5 F (36.9 C)] 97.4 F (36.3 C) (02/22 0800) Pulse Rate:  [65-83] 66 (02/22 0800) Resp:  [17-39] 27 (02/22 0800) BP: (122-179)/(44-71) 144/46 (02/22 0800) SpO2:  [81 %-100 %] 94 % (02/22 0800) FiO2 (%):  [50 %-60 %] 60 % (02/22 0800) Weight:  [52 kg (114 lb 10.2 oz)] 52 kg (114 lb 10.2 oz) (02/22 0421)  Weight change: -3.7 kg (-2.5 oz) Filed Weights   02/24/17 1200 02/24/17 1400 02/07/2017 0421  Weight: 55.7 kg (122 lb 12.7 oz) 55.1 kg (121 lb 7.6 oz) 52 kg (114 lb 10.2 oz)    Intake/Output: I/O last 3 completed shifts: In: 700 [P.O.:600; IV Piggyback:100] Out: 959 [Urine:625; Other:334]   Intake/Output this shift:  No intake/output data recorded.  Physical Exam: General: No acute distress  Head: Normocephalic, atraumatic. Moist oral mucosal membranes  Eyes: Anicteric  Neck: Supple, trachea midline  Lungs:  B/l rhonchi and crackles, normal effort,    Heart: S1S2 no rubs  Abdomen:  Soft, nontender, bowel sounds present  Extremities: + peripheral edema.  Neurologic: Awake, alert, following commands  Skin: No lesions   Femoral dialysis cathter    Basic Metabolic Panel: Recent Labs  Lab 02/20/17 0541 02/21/17 0540 02/21/17 1400 02/22/17 0450 02/23/17 0540 02/24/17 0606 02/25/17 0518 02/06/2017 0530  NA 138 137  --  140 138 141 139 138  K 4.8 5.0  --  5.1 5.1 5.2* 5.1 4.9  CL 105 105  --  108 106 108 104 105  CO2 21* 20*  --  21* 24 26 23 25   GLUCOSE 181* 204*  --  220*  193* 128* 116* 168*  BUN 82* 93*  --  99* 90* 89* 78* 80*  CREATININE 3.54* 3.60*  --  3.24* 2.99* 3.00* 2.94* 3.02*  CALCIUM 8.4* 8.4*  --  8.5* 8.5* 8.8* 8.4* 8.4*  MG  --  2.1  --   --   --   --  2.1  --   PHOS 5.7* 5.2* 4.6 4.5  --   --  4.8*  --     Liver Function Tests: Recent Labs  Lab 02/20/17 0541 02/21/17 0540 02/22/17 0450  ALBUMIN 2.1* 2.3* 2.2*   No results for input(s): LIPASE, AMYLASE in the last 168 hours. No results for input(s): AMMONIA in the last 168 hours.  CBC: Recent Labs  Lab 02/22/17 0450 02/23/17 0540 02/24/17 0606 02/25/17 0518 02/25/2017 0530  WBC 8.6 10.0 13.5* 20.0* 19.0*  NEUTROABS 8.0* 9.0* 12.4*  --  17.7*  HGB 9.0* 9.3* 9.2* 7.4* 6.3*  HCT 27.3* 27.9* 28.4* 22.7* 19.5*  MCV 86.5 87.3 87.2 86.9 87.7  PLT 436 401 375 255 217    Cardiac Enzymes: No results for input(s): CKTOTAL, CKMB, CKMBINDEX, TROPONINI in the last 168 hours.  BNP: Invalid input(s): POCBNP  CBG: Recent Labs  Lab 02/21/17 0731 02/22/17 0719 02/23/17 0757 02/25/17 0741 02/16/2017 0741  GLUCAP 199* 196* 165* 111* 150*  Microbiology: Results for orders placed or performed during the hospital encounter of 03/04/2017  Blood culture (routine x 2)     Status: None   Collection Time: 02/28/2017  9:29 PM  Result Value Ref Range Status   Specimen Description BLOOD BLOOD RIGHT FOREARM  Final   Special Requests   Final    BOTTLES DRAWN AEROBIC AND ANAEROBIC Blood Culture adequate volume   Culture   Final    NO GROWTH 5 DAYS Performed at [facility], [address] [place], [place] [address]    Report Status 02/23/2017 FINAL  Final  MRSA PCR Screening     Status: None   Collection Time: 02/19/17  2:13 AM  Result Value Ref Range Status   MRSA by PCR NEGATIVE NEGATIVE Final    Comment:        The GeneXpert MRSA Assay (FDA approved for NASAL specimens only), is one component of a comprehensive MRSA colonization surveillance program. It is not intended to  diagnose MRSA infection nor to guide or monitor treatment for MRSA infections. Performed at [facility], [address] [place], [place] [address]   Blood culture (routine x 2)     Status: None   Collection Time: 02/19/17  3:30 AM  Result Value Ref Range Status   Specimen Description BLOOD RIGHT ANTECUBITAL  Final   Special Requests   Final    BOTTLES DRAWN AEROBIC AND ANAEROBIC Blood Culture adequate volume   Culture   Final    NO GROWTH 5 DAYS Performed at [facility] [address], [place],  [address]    Report Status 02/24/2017 FINAL  Final    Coagulation Studies: No results for input(s): LABPROT, INR in the last 72 hours.  Urinalysis: No results for input(s): COLORURINE, LABSPEC, PHURINE, GLUCOSEU, HGBUR, BILIRUBINUR, KETONESUR, PROTEINUR, UROBILINOGEN, NITRITE, LEUKOCYTESUR in the last 72 hours.  Invalid input(s): APPERANCEUR    Imaging: Dg Chest Port 1 View  Result Date: 02/25/2017 CLINICAL DATA:  Respiratory failure. EXAM: PORTABLE CHEST 1 VIEW COMPARISON:  Chest x-ray 02/22/2017. Chest x-ray 02/21/2017. Chest CT 03/02/2017. FINDINGS: Mediastinum is normal. Heart size stable. Diffuse bilateral pulmonary infiltrates/edema again noted. No significant interim change. No pleural effusion or pneumothorax. IMPRESSION: Diffuse bilateral pulmonary infiltrates/edema again noted. No significant interim change. Electronically Signed   By: Marcello Moores  Register   On: 02/25/2017 06:46     Medications:   . sodium chloride    . ceFEPime (MAXIPIME) IV Stopped (02/23/2017 0224)   . calcium acetate  667 mg Oral TID WC  . docusate sodium  100 mg Oral BID  . feeding supplement (NEPRO CARB STEADY)  237 mL Oral TID BM  . ferrous sulfate  325 mg Oral Q breakfast  . heparin  5,000 Units Subcutaneous Q8H  . levothyroxine  88 mcg Oral QAC breakfast  . lidocaine  10 mL Intradermal Once  . metoprolol  100 mg Oral QHS  . multivitamin  1 tablet Oral QHS  .  PARoxetine  10 mg Oral Daily  . pravastatin  20 mg Oral Daily  . tuberculin  5 Units Intradermal Once   acetaminophen **OR** acetaminophen, ALPRAZolam, bisacodyl, cloNIDine, HYDROcodone-acetaminophen, ipratropium-albuterol, ondansetron **OR** ondansetron (ZOFRAN) IV, traZODone  Assessment/ Plan:  82 y.o. female with a PMHx of renal cell carcinoma status post left nephrectomy (08/2000) and partial right nephrectomy (06/2002), hypertension, hyperlipidemia, anemia of chronic kidney disease, secondary hyperparathyroidism, osteoporosis, history of breast cancer, who was admitted to [facility] on 02/09/2017 for evaluation of significant  shortness of breath.    1.  ESRD 2.  CKD secondary to left nephrectomy and partial right nephrectomy. 3.  Anemia of CKD.   4.  Secondary hyperparathyroidism. 5.  Bacterial pneumonia.,Acute pulmonary edema 6.  1.8 cm Right renal mass.   Plan:  Fluid removal limited due to intradialytic hypotension Will try UF again today Permcath placement today Start d/c planning for outpatient HD. Patient prefers [facility]  Late entry: patient underwent HD today. She became anxious during the treatment. 1400 cc fluid was removed with UF but patient could not tolerate the procedure well due to BP fluctuations and anxiety/increased work of breathing About a little more than hour into treatment the system clotted therefore treatment was stopped Case discussed with patient's husband  Will evaluate for HD tomorrow     LOS: 7 Joseff Luckman 2/22/20198:54 AM

## 2017-02-26 NOTE — Progress Notes (Signed)
Pharmacy Antibiotic Note  Erin Mccormick is a 82 y.o. female admitted on 02/17/2017 with HCAP.  Pharmacy has been consulted for cefepime dosing.  Plan: Cefepime 2 grams q 24 hours ordered.  Height: 5\' 5"  (165.1 cm) Weight: 121 lb 7.6 oz (55.1 kg) IBW/kg (Calculated) : 57  Temp (24hrs), Avg:98.3 F (36.8 C), Min:98 F (36.7 C), Max:98.5 F (36.9 C)  Recent Labs  Lab 02/19/17 0330  02/21/17 0540 02/22/17 0450 02/23/17 0540 02/24/17 0606 02/25/17 0518  WBC 7.8  --  11.3* 8.6 10.0 13.5* 20.0*  CREATININE 3.61*   < > 3.60* 3.24* 2.99* 3.00* 2.94*  LATICACIDVEN 0.7  --   --   --   --   --   --    < > = values in this interval not displayed.    Estimated Creatinine Clearance: 13.5 mL/min (A) (by C-G formula based on SCr of 2.94 mg/dL (H)).    Allergies  Allergen Reactions  . Morphine Nausea Only and Nausea And Vomiting  . Ace Inhibitors Other (See Comments)    hyperkalemia   . Morphine And Related Nausea And Vomiting  . Sulfamethoxazole-Trimethoprim Nausea And Vomiting  . Allopurinol Diarrhea  . Other Rash    Adhesive bandages- causes skin irritation and redness    Antimicrobials this admission: Azithromycin, ceftriaxone 2/14-2/18  >> cefepime 2/22   >>   Dose adjustments this admission:   Microbiology results: 2/15 BCx: NG x5d      2/22 CXR pending Thank you for allowing pharmacy to be a part of this patient's care.  Erin Mccormick S 02/25/2017 1:45 AM

## 2017-02-26 NOTE — Progress Notes (Signed)
HD tx start 

## 2017-02-26 NOTE — Progress Notes (Signed)
   02/25/2017 1440  Clinical Encounter Type  Visited With Patient and family together  Visit Type Follow-up   Chaplain attempted visit with patient and family.  Health care provider in the room, visitor indicated that they would prefer a visit at another time.

## 2017-02-26 NOTE — Progress Notes (Signed)
Patient with increasing oxygen requirements. Presently on heated high flow. Has had difficulty with dialysis flow rates. She is pending access today with vascular surgery. Her hemoglobin came back low at 6.3 pending 1 unit of packed red blood cells. She is to be dialyzed after her catheter has been placed today.  Vitals:   02/10/2017 0500 02/13/2017 0600 02/18/2017 0700 02/07/2017 0800  BP: (!) 124/51 (!) 142/69 (!) 124/50 (!) 144/46  Pulse: 68 69 67 66  Resp: (!) 26 (!) 31 (!) 27 (!) 27  Temp:    (!) 97.4 F (36.3 C)  TempSrc:    Oral  SpO2: 96% 96% 96% 94%  Weight:      Height:         Gen: Elderly, frail in NAD HEENT: NCAT, sclerae white, oropharynx normal Neck: No LAN, no JVD noted Lungs: full BS, faint bibasilar crackles, no wheezes Cardiovascular: Regular, no M noted Abdomen: Soft, NT, +BS Ext: no C/C/E Neuro: No focal deficits Skin: No lesions noted   BMP Latest Ref Rng & Units 02/13/2017 02/25/2017 02/24/2017  Glucose 65 - 99 mg/dL 168(H) 116(H) 128(H)  BUN 6 - 20 mg/dL 80(H) 78(H) 89(H)  Creatinine 0.44 - 1.00 mg/dL 3.02(H) 2.94(H) 3.00(H)  Sodium 135 - 145 mmol/L 138 139 141  Potassium 3.5 - 5.1 mmol/L 4.9 5.1 5.2(H)  Chloride 101 - 111 mmol/L 105 104 108  CO2 22 - 32 mmol/L 25 23 26   Calcium 8.9 - 10.3 mg/dL 8.4(L) 8.4(L) 8.8(L)    CBC Latest Ref Rng & Units 02/28/2017 02/25/2017 02/24/2017  WBC 3.6 - 11.0 K/uL 19.0(H) 20.0(H) 13.5(H)  Hemoglobin 12.0 - 16.0 g/dL 6.3(L) 7.4(L) 9.2(L)  Hematocrit 35.0 - 47.0 % 19.5(L) 22.7(L) 28.4(L)  Platelets 150 - 440 K/uL 217 255 375   CXR: Persistent symmetric bilateral infiltrates consistent with edema versus pneumonitis  IMP: Acute hypoxemic respiratory failure -increasing oxygen requirements Bilateral pulmonary infiltrates -edema versus pneumonitis AKI/CKD -has progressed to dialysis dependence   PLAN/REC: Patient is to have hemodialysis access placed today by vascular surgery. Pending hemodialysis post procedure. Work up for  interstitial lung disease is being performed. Reviewing remote CT scan does show some evidence of reticulation lower greater than upper lung zone with some evidence of traction bronchiectasis. She did also however have bilateral effusions with groundglass opacities. Agree with plan of hemodialysis post vascular access to see if can pull fluid off. She is presently on heated high flow and cefepime  She remains a full code but is probably a poor candidate for ACLS or intubation.  We should probably be addressing goals of care  PermCath placement is planned for 2/22  She needs to remain in SDU as long as she is on [place], DO   02/22/2017 8:44 AM Patient ID: Erin Mccormick, female   DOB: November 26, 1935, 82 y.o.   MRN: [id number]

## 2017-02-26 NOTE — Progress Notes (Signed)
Pulmonary/critical care  Discussed with husband.  We are going to proceed with comfort care measures.  Will place on fentanyl and Ativan as needed via as needed dosing.  Comfort care measures instituted.  Hermelinda Dellen, DO

## 2017-02-26 NOTE — Progress Notes (Signed)
Post HD assessment. Pt unable to complete HD tx r/t anxiety/panic attacks. ICU RN and MD at bedside. Pt's 02 sat continued to decline. MD made aware. MD will re-evaluate pt in the am. Pt's husband was asked to call family and asked if they would like a chaplin at bedside. Husband declined chaplin. Net UF [phone].

## 2017-02-26 NOTE — Care Management (Signed)
PPD results due by Sunday 02/28/17. Sticky note applied to Epic for team. Notified RNCM for weekend follow up.

## 2017-02-26 NOTE — Progress Notes (Signed)
HD tx end  

## 2017-02-26 NOTE — Progress Notes (Signed)
Pt returned from Vascular Suite at 1230. Right chest [facility] site intact. VS obtained and stable. Breathing regular and unlabored, Pt lethargic, slightly more than pre procedure but responds well to voice. Per request Dr. Delana Meyer Pt returned to ICU room 19 at 1245 for observation immediately post procedure .

## 2017-02-26 NOTE — Progress Notes (Signed)
Pre HD assessment  

## 2017-02-26 NOTE — Progress Notes (Signed)
Post HD assessment  

## 2017-02-26 NOTE — Progress Notes (Signed)
Patient ID: Erin Mccormick, female   DOB: 11/10/1935, 82 y.o.   MRN: [id number]  [facility] PROGRESS NOTE  Erin Mccormick BPZ:[id number] DOB: 1935-07-27 DOA: 02/21/2017 PCP: Dion Body, MD  HPI/Subjective: Feels tired, hemoglobin low.  Scheduled for permacath placement today.  Husband at bedside.  On 6 L of oxygen  Objective: Vitals:   02/16/2017 1448 02/11/2017 1500  BP: 121/84 114/62  Pulse: 91 86  Resp: (!) 21 (!) 31  Temp: (!) 96.8 F (36 C)   SpO2: 90% 90%    Filed Weights   03/03/2017 1038 02/17/2017 1330 02/28/2017 1448  Weight: 51.7 kg (114 lb) 54.7 kg (120 lb 9.5 oz) 51.2 kg (112 lb 14 oz)    ROS: Review of Systems  Constitutional: Negative for chills and fever.  Eyes: Negative for blurred vision.  Respiratory: Positive for cough and shortness of breath.   Cardiovascular: Negative for chest pain.  Gastrointestinal: Negative for abdominal pain, constipation, diarrhea, nausea and vomiting.  Genitourinary: Negative for dysuria.  Musculoskeletal: Negative for joint pain.  Neurological: Negative for dizziness and headaches.   Exam: Physical Exam  Constitutional: She is oriented to person, place, and time.  HENT:  Nose: No mucosal edema.  Mouth/Throat: No oropharyngeal exudate or posterior oropharyngeal edema.  Eyes: Conjunctivae, EOM and lids are normal. Pupils are equal, round, and reactive to light.  Neck: No JVD present. Carotid bruit is not present. No edema present. No thyroid mass and no thyromegaly present.  Cardiovascular: S1 normal and S2 normal. Exam reveals no gallop.  No murmur heard. Pulses:      Dorsalis pedis pulses are 2+ on the right side, and 2+ on the left side.  Respiratory: No respiratory distress. She has decreased breath sounds in the right middle field, the right lower field and the left lower field. She has no wheezes. She has rhonchi in the right lower field and the left lower field. She has no rales.  GI: Soft. Bowel sounds are normal.  There is no tenderness.  Musculoskeletal:       Right ankle: She exhibits no swelling.       Left ankle: She exhibits no swelling.  Lymphadenopathy:    She has no cervical adenopathy.  Neurological: She is alert and oriented to person, place, and time. No cranial nerve deficit.  Skin: Skin is warm. No rash noted. Nails show no clubbing.  Psychiatric: She has a normal mood and affect.      Data Reviewed: Basic Metabolic Panel: Recent Labs  Lab 02/20/17 0541 02/21/17 0540 02/21/17 1400 02/22/17 0450 02/23/17 0540 02/24/17 0606 02/25/17 0518 02/24/2017 0530  NA 138 137  --  140 138 141 139 138  K 4.8 5.0  --  5.1 5.1 5.2* 5.1 4.9  CL 105 105  --  108 106 108 104 105  CO2 21* 20*  --  21* 24 26 23 25   GLUCOSE 181* 204*  --  220* 193* 128* 116* 168*  BUN 82* 93*  --  99* 90* 89* 78* 80*  CREATININE 3.54* 3.60*  --  3.24* 2.99* 3.00* 2.94* 3.02*  CALCIUM 8.4* 8.4*  --  8.5* 8.5* 8.8* 8.4* 8.4*  MG  --  2.1  --   --   --   --  2.1  --   PHOS 5.7* 5.2* 4.6 4.5  --   --  4.8*  --    Liver Function Tests: Recent Labs  Lab 02/20/17 0541 02/21/17 0540 02/22/17 0450  ALBUMIN 2.1* 2.3* 2.2*   CBC: Recent Labs  Lab 02/22/17 0450 02/23/17 0540 02/24/17 0606 02/25/17 0518 02/13/2017 0530  WBC 8.6 10.0 13.5* 20.0* 19.0*  NEUTROABS 8.0* 9.0* 12.4*  --  17.7*  HGB 9.0* 9.3* 9.2* 7.4* 6.3*  HCT 27.3* 27.9* 28.4* 22.7* 19.5*  MCV 86.5 87.3 87.2 86.9 87.7  PLT 436 401 375 255 217     CBG: Recent Labs  Lab 02/21/17 0731 02/22/17 0719 02/23/17 0757 02/25/17 0741 02/11/2017 0741  GLUCAP 199* 196* 165* 111* 150*    Recent Results (from the past 240 hour(s))  Blood culture (routine x 2)     Status: None   Collection Time: 02/16/2017  9:29 PM  Result Value Ref Range Status   Specimen Description BLOOD BLOOD RIGHT FOREARM  Final   Special Requests   Final    BOTTLES DRAWN AEROBIC AND ANAEROBIC Blood Culture adequate volume   Culture   Final    NO GROWTH 5 DAYS Performed at  [facility], [address] [place], [place] [address]    Report Status 02/23/2017 FINAL  Final  MRSA PCR Screening     Status: None   Collection Time: 02/19/17  2:13 AM  Result Value Ref Range Status   MRSA by PCR NEGATIVE NEGATIVE Final    Comment:        The GeneXpert MRSA Assay (FDA approved for NASAL specimens only), is one component of a comprehensive MRSA colonization surveillance program. It is not intended to diagnose MRSA infection nor to guide or monitor treatment for MRSA infections. Performed at [facility], [address] [place], [place] [address]   Blood culture (routine x 2)     Status: None   Collection Time: 02/19/17  3:30 AM  Result Value Ref Range Status   Specimen Description BLOOD RIGHT ANTECUBITAL  Final   Special Requests   Final    BOTTLES DRAWN AEROBIC AND ANAEROBIC Blood Culture adequate volume   Culture   Final    NO GROWTH 5 DAYS Performed at [facility], [address] [place], [place] [address]    Report Status 02/24/2017 FINAL  Final     Studies: Dg Chest Port 1 View  Result Date: 02/25/2017 CLINICAL DATA:  Respiratory failure. EXAM: PORTABLE CHEST 1 VIEW COMPARISON:  Chest x-ray 02/22/2017. Chest x-ray 02/21/2017. Chest CT 02/05/2017. FINDINGS: Mediastinum is normal. Heart size stable. Diffuse bilateral pulmonary infiltrates/edema again noted. No significant interim change. No pleural effusion or pneumothorax. IMPRESSION: Diffuse bilateral pulmonary infiltrates/edema again noted. No significant interim change. Electronically Signed   By: Marcello Moores  Register   On: 02/25/2017 06:46    Scheduled Meds: . calcium acetate  667 mg Oral TID WC  . docusate sodium  100 mg Oral BID  . feeding supplement (NEPRO CARB STEADY)  237 mL Oral TID BM  . ferrous sulfate  325 mg Oral Q breakfast  . heparin  5,000 Units Subcutaneous Q8H  . levothyroxine  88 mcg Oral QAC breakfast  . lidocaine  10 mL Intradermal Once  .  metoprolol  100 mg Oral QHS  . multivitamin  1 tablet Oral QHS  . PARoxetine  10 mg Oral Daily  . pravastatin  20 mg Oral Daily  . tuberculin  5 Units Intradermal Once   Continuous Infusions: . ceFEPime (MAXIPIME) IV    . dexmedetomidine (PRECEDEX) IV infusion 0.4 mcg/kg/hr (02/12/2017 1420)    Assessment/Plan:   1. Acute hypoxic respiratory failure with chronic history of  interstitial lung disease  patient on 6 L nasal cannula. 2. Bilateral pneumonia.  Rocephin and Solu-Medrol stopped by critical care specialist. CXR in am 3. Acute kidney disease on chronic kidney disease stage IV.  Nephrology started dialysis  Patient history of renal cell cancer status post left nephrectomy and partial nephrectomy on right; permacath placement today for continuation of the hemodialysis 4. Acute diastolic congestive heart failure.  Dialysis to help out with fluid removal 5. Right renal mass. 6. Hypothyroidism unspecified on levothyroxine 7. Anxiety depression on Paxil 8. Hyperlipidemia unspecified on pravastatin 9. Essential hypertension on metoprolol 10. History of breast cancer on anastrozole  Poor prognosis  Code Status:     Code Status Orders  (From admission, onward)        Start     Ordered   02/19/17 0226  Full code  Continuous     02/19/17 0225    Code Status History    Date Active Date Inactive Code Status Order ID Comments User Context   07/01/2016 08:53 07/02/2016 14:29 Full Code [id number]  Saundra Shelling, MD Inpatient    Advance Directive Documentation     Most Recent Value  Type of Advance Directive  Living will  Pre-existing out of facility DNR order (yellow form or pink MOST form)  No data  "MOST" Form in Place?  No data     Family Communication: none today Disposition Plan: To be determined  Consultants:  Critical care specialist  Nephrologist  Antibiotics:  none  Time spent: 25 minutes  [place]

## 2017-02-26 NOTE — Progress Notes (Signed)
Pulmonary/critical care attending  Erin Mccormick status has dramatically deteriorated.  Presently she is unresponsive, agonal respirations.  I went in and spoke with her husband.  He understands this and wishes [place] to continue comfort approach.  We will give her Ativan as needed for air hunger and anxiety.  Hermelinda Dellen, DO

## 2017-02-26 NOTE — Progress Notes (Signed)
Pulmonary/critical care attending  Patient's respiratory status has worsened. Presently on heated high flow. Discussed with both Mrs. Erin Mccormick her husband about goals of care and CODE STATUS. They do not wish her to be intubated or CPR if that were to occur. We'll place DO NOT RESUSCITATE in chart.  Hermelinda Dellen, D.O.

## 2017-02-26 NOTE — Progress Notes (Signed)
Pharmacy Antibiotic Note  Erin Mccormick is a 82 y.o. female admitted on 02/08/2017 with HCAP.  Pharmacy has been consulted for cefepime dosing. Patient is on intermittent HD. Patient already received cefepime 2g IV x 1 dose 2/22 @ 0143.  Plan: Cefepime 1g IV post first HD session, then 1g IV q24h starting 2/22 @ 2000. Will follow up to ensure patient tolerates dialysis and pharmacy will continue to monitor per consult.  Height: 5\' 5"  (165.1 cm) Weight: 114 lb 10.2 oz (52 kg) IBW/kg (Calculated) : 57  Temp (24hrs), Avg:97.9 F (36.6 C), Min:97.4 F (36.3 C), Max:98.5 F (36.9 C)  Recent Labs  Lab 02/22/17 0450 02/23/17 0540 02/24/17 0606 02/25/17 0518 03/02/2017 0530  WBC 8.6 10.0 13.5* 20.0* 19.0*  CREATININE 3.24* 2.99* 3.00* 2.94* 3.02*    Estimated Creatinine Clearance: 12.4 mL/min (A) (by C-G formula based on SCr of 3.02 mg/dL (H)).    Allergies  Allergen Reactions  . Morphine Nausea Only and Nausea And Vomiting  . Ace Inhibitors Other (See Comments)    hyperkalemia   . Morphine And Related Nausea And Vomiting  . Sulfamethoxazole-Trimethoprim Nausea And Vomiting  . Allopurinol Diarrhea  . Other Rash    Adhesive bandages- causes skin irritation and redness    Antimicrobials this admission: Azithromycin, ceftriaxone 2/14-2/18  >>  cefepime 2/22 >>   Dose adjustments this admission: Cefepime 2g q24h >> 1g post first HD, then 1g q24h  Microbiology results: 2/15 BCx: neg x5d 2/15 MRSA: neg  Thank you for allowing pharmacy to be a part of this patient's care.  Martinique Lowery Paullin  Pharmacy Intern 02/18/2017 9:44 AM

## 2017-02-26 NOTE — Op Note (Signed)
OPERATIVE NOTE   PROCEDURE: 1. Insertion of tunneled dialysis catheter right IJ approach with ultrasound and fluoroscopic guidance.  PRE-OPERATIVE DIAGNOSIS: End-stage renal disease requiring hemodialysis  POST-OPERATIVE DIAGNOSIS: Same  SURGEON: Hortencia Pilar.  ANESTHESIA: Conscious sedation was administered under my direct supervision by the interventional radiology RN. IV Versed plus fentanyl were utilized. Continuous ECG, pulse oximetry and blood pressure was monitored throughout the entire procedure. Conscious sedation was for a total of 25 minutes.  ESTIMATED BLOOD LOSS: Minimal cc  CONTRAST USED:  None  FLUOROSCOPY TIME: 0.4 minutes  INDICATIONS:   Erin Mccormick Mooreis a 82 y.o. y.o. female who presents with multisystem organ dysfunction.  Her renal insufficiency is now progressed to end-stage and she is requiring hemodialysis.  Tunnel catheter is being placed as her access to allow for dialysis therapy.  The risks and benefits were reviewed all questions answered patient's husband as well as the patient has agreed to proceed.  DESCRIPTION: After obtaining full informed written consent, the patient was positioned supine. The right neck and chest wall was prepped and draped in a sterile fashion. Ultrasound was placed in a sterile sleeve. Ultrasound was utilized to identify the right internal jugular vein which is noted to be echolucent and compressible indicating patency. Image is recorded for the permanent record. Under direct ultrasound visualization a micro-needle is inserted into the vein followed by the micro-wire. Micro-sheath was then advanced and a J wire is inserted without difficulty under fluoroscopic guidance. Small counterincision was made at the wire insertion site. Dilators are passed over the wire and the tunneled dialysis catheter is fed into the central venous system without difficulty.  Under fluoroscopy the catheter tip positioned at the atrial caval junction. The  catheter is then approximated to the chest wall and an exit site selected. 1% lidocaine is infiltrated in soft tissues at this level small incision is made and the tunneling device is then passed from the exit site to the neck counterincision. Catheter is then connected to the tunneling device and the catheter was pulled subcutaneously. It is then transected and the hub assembly connected without difficulty. Both lumens aspirate and flush easily. After verification of smooth contour with proper tip position under fluoroscopy the catheter is packed with 5000 units of heparin per lumen.  Catheter secured to the skin of the right chest wall with 0 silk. A sterile dressing is applied with a Biopatch.  COMPLICATIONS: None  CONDITION: Good  Hortencia Pilar [facility]. Office:  [phone]   02/13/2017,12:34 PM

## 2017-02-27 LAB — BPAM RBC
Blood Product Expiration Date: 201903072359
ISSUE DATE / TIME: 201902220921
UNIT TYPE AND RH: 6200

## 2017-02-27 LAB — TYPE AND SCREEN
ABO/RH(D): A POS
ANTIBODY SCREEN: NEGATIVE
Unit division: 0

## 2017-02-27 MED ORDER — FENTANYL CITRATE (PF) 100 MCG/2ML IJ SOLN
50.0000 ug | INTRAMUSCULAR | Status: DC | PRN
  Administered 2017-02-28 (×5): 100 ug via INTRAVENOUS
  Filled 2017-02-27 (×5): qty 2

## 2017-02-27 MED ORDER — GLYCOPYRROLATE 0.2 MG/ML IJ SOLN
0.2000 mg | Freq: Four times a day (QID) | INTRAMUSCULAR | Status: DC | PRN
  Administered 2017-02-28 (×2): 0.2 mg via INTRAVENOUS
  Filled 2017-02-27 (×3): qty 1

## 2017-02-27 MED ORDER — HYDROMORPHONE HCL 1 MG/ML IJ SOLN
0.5000 mg | Freq: Once | INTRAMUSCULAR | Status: AC
  Administered 2017-02-27: 0.5 mg via INTRAVENOUS
  Filled 2017-02-27: qty 1

## 2017-02-27 NOTE — Progress Notes (Signed)
Patient ID: Erin Mccormick, female   DOB: 1935-10-27, 82 y.o.   MRN: [id number]  [facility] PROGRESS NOTE  PHYLICIA MCGAUGH VEH:[id number] DOB: 08/07/1935 DOA: 02/24/2017 PCP: Dion Body, MD  HPI/Subjective: Patient had permacath placement yesterday and following that had dialysis but very tired following dialysis and decided not to pursue hemodialysis in future and CODE STATUS was changed to comfort care  Objective: Vitals:   02/27/17 0911 02/27/17 1200  BP:  (!) 166/57  Pulse:  84  Resp:  (!) 23  Temp:    SpO2: 92% (!) 88%    Filed Weights   02/13/2017 1038 02/27/2017 1330 02/21/2017 1448  Weight: 51.7 kg (114 lb) 54.7 kg (120 lb 9.5 oz) 51.2 kg (112 lb 14 oz)    ROS: Review of Systems  Constitutional: Negative for chills and fever.  Eyes: Negative for blurred vision.  Respiratory: Positive for cough and shortness of breath.   Cardiovascular: Negative for chest pain.  Gastrointestinal: Negative for abdominal pain, constipation, diarrhea, nausea and vomiting.  Genitourinary: Negative for dysuria.  Musculoskeletal: Negative for joint pain.  Neurological: Negative for dizziness and headaches.   Exam: Physical Exam  Constitutional: She is oriented to person, place, and time.  HENT:  Nose: No mucosal edema.  Mouth/Throat: No oropharyngeal exudate or posterior oropharyngeal edema.  Eyes: Conjunctivae, EOM and lids are normal. Pupils are equal, round, and reactive to light.  Neck: No JVD present. Carotid bruit is not present. No edema present. No thyroid mass and no thyromegaly present.  Cardiovascular: S1 normal and S2 normal. Exam reveals no gallop.  No murmur heard. Pulses:      Dorsalis pedis pulses are 2+ on the right side, and 2+ on the left side.  Respiratory: No respiratory distress. She has decreased breath sounds in the right middle field, the right lower field and the left lower field. She has no wheezes. She has rhonchi in the right lower field and the left lower  field. She has no rales.  GI: Soft. Bowel sounds are normal. There is no tenderness.  Musculoskeletal:       Right ankle: She exhibits no swelling.       Left ankle: She exhibits no swelling.  Lymphadenopathy:    She has no cervical adenopathy.  Neurological: She is alert and oriented to person, place, and time. No cranial nerve deficit.  Skin: Skin is warm. No rash noted. Nails show no clubbing.  Psychiatric: She has a normal mood and affect.      Data Reviewed: Basic Metabolic Panel: Recent Labs  Lab 02/21/17 0540 02/21/17 1400 02/22/17 0450 02/23/17 0540 02/24/17 0606 02/25/17 0518 03/02/2017 0530  NA 137  --  140 138 141 139 138  K 5.0  --  5.1 5.1 5.2* 5.1 4.9  CL 105  --  108 106 108 104 105  CO2 20*  --  21* 24 26 23 25   GLUCOSE 204*  --  220* 193* 128* 116* 168*  BUN 93*  --  99* 90* 89* 78* 80*  CREATININE 3.60*  --  3.24* 2.99* 3.00* 2.94* 3.02*  CALCIUM 8.4*  --  8.5* 8.5* 8.8* 8.4* 8.4*  MG 2.1  --   --   --   --  2.1  --   PHOS 5.2* 4.6 4.5  --   --  4.8*  --    Liver Function Tests: Recent Labs  Lab 02/21/17 0540 02/22/17 0450  ALBUMIN 2.3* 2.2*   CBC: Recent Labs  Lab 02/22/17 0450 02/23/17 0540 02/24/17 0606 02/25/17 0518 02/24/2017 0530  WBC 8.6 10.0 13.5* 20.0* 19.0*  NEUTROABS 8.0* 9.0* 12.4*  --  17.7*  HGB 9.0* 9.3* 9.2* 7.4* 6.3*  HCT 27.3* 27.9* 28.4* 22.7* 19.5*  MCV 86.5 87.3 87.2 86.9 87.7  PLT 436 401 375 255 217     CBG: Recent Labs  Lab 02/21/17 0731 02/22/17 0719 02/23/17 0757 02/25/17 0741 03/03/2017 0741  GLUCAP 199* 196* 165* 111* 150*    Recent Results (from the past 240 hour(s))  Blood culture (routine x 2)     Status: None   Collection Time: 03/02/2017  9:29 PM  Result Value Ref Range Status   Specimen Description BLOOD BLOOD RIGHT FOREARM  Final   Special Requests   Final    BOTTLES DRAWN AEROBIC AND ANAEROBIC Blood Culture adequate volume   Culture   Final    NO GROWTH 5 DAYS Performed at [facility], [address] [place], [place] [address]    Report Status 02/23/2017 FINAL  Final  MRSA PCR Screening     Status: None   Collection Time: 02/19/17  2:13 AM  Result Value Ref Range Status   MRSA by PCR NEGATIVE NEGATIVE Final    Comment:        The GeneXpert MRSA Assay (FDA approved for NASAL specimens only), is one component of a comprehensive MRSA colonization surveillance program. It is not intended to diagnose MRSA infection nor to guide or monitor treatment for MRSA infections. Performed at [facility], [address] [place], [place] [address]   Blood culture (routine x 2)     Status: None   Collection Time: 02/19/17  3:30 AM  Result Value Ref Range Status   Specimen Description BLOOD RIGHT ANTECUBITAL  Final   Special Requests   Final    BOTTLES DRAWN AEROBIC AND ANAEROBIC Blood Culture adequate volume   Culture   Final    NO GROWTH 5 DAYS Performed at [facility], [address] [place], [place] [address]    Report Status 02/24/2017 FINAL  Final     Studies: No results found.  Scheduled Meds:  Continuous Infusions:   Assessment/Plan:   1. Acute hypoxic respiratory failure with chronic history of interstitial lung disease  patient on 6 L nasal cannula. 2. Bilateral pneumonia.  Rocephin and Solu-Medrol stopped by critical care specialist. CXR in am 3. Acute kidney disease on chronic kidney disease stage IV.  Nephrology started dialysis  Patient history of renal cell cancer status post left nephrectomy and partial nephrectomy on right; 4. Acute diastolic congestive heart failure.   5. Right renal mass. 6. Hypothyroidism unspecified on levothyroxine 7. Anxiety depression on Paxil 8. Hyperlipidemia unspecified on pravastatin 9. Essential hypertension on metoprolol 10. History of breast cancer on anastrozole 11. Adult failure to thrive  Poor prognosis, DNR with comfort care patient will be transferred to the floor    Code  Status:     Code Status Orders  (From admission, onward)        Start     Ordered   02/19/17 0226  Full code  Continuous     02/19/17 0225    Code Status History    Date Active Date Inactive Code Status Order ID Comments User Context   07/01/2016 08:53 07/02/2016 14:29 Full Code [id number]  Saundra Shelling, MD Inpatient    Advance Directive Documentation     Most Recent Value  Type of Advance Directive  Living will  Pre-existing out of facility DNR order (yellow form or pink MOST form)  No data  "MOST" Form in Place?  No data     Family Communication: none today Disposition Plan: To be determined  Consultants:  Critical care specialist  Nephrologist  Antibiotics:  none  Time spent: 25 minutes  [place]

## 2017-02-27 NOTE — Progress Notes (Signed)
Pulmonary/critical care attending  Patient presently on comfort care. Is on Ativan and fentanyl every 1 hour as needed. She is a little more responsive this morning, weaning to nasal cannula. Had discussion with husband and with patient. Will transfer to floor service when bed becomes available. Pain and symptomatic control only. Patient does not wish any additional labs or hemodialysis.  Erin Mccormick, D.O.

## 2017-02-27 NOTE — Plan of Care (Signed)
Patient continues on palliative care.  Husband at bedside.  No distress noted. No PRN meds given this shift for anxiety.  Patient states she does NOT want dialysis EVER.  Patient requested this be documented in her chart.  Will continue to monitor.

## 2017-02-28 MED ORDER — HYDROMORPHONE HCL 1 MG/ML IJ SOLN
1.0000 mg | Freq: Once | INTRAMUSCULAR | Status: AC
  Administered 2017-02-28: 1 mg via INTRAVENOUS
  Filled 2017-02-28: qty 1

## 2017-02-28 MED ORDER — HYDROMORPHONE HCL 1 MG/ML IJ SOLN
1.0000 mg | INTRAMUSCULAR | Status: DC | PRN
  Administered 2017-02-28: 1 mg via INTRAVENOUS
  Filled 2017-02-28: qty 1

## 2017-02-28 NOTE — Care Management Note (Signed)
Case Management Note  Patient Details  Name: SHADIE SWEATMAN MRN: [id number] Date of Birth: July 25, 1935  Subjective/Objective:   Discussed with Robyn RN that Mrs Wegener's PPD needs to be read today per Levada Dy Johnson's handoff note. Not sure if this is still needed per Mrs Poppell is now [place]. Robyn agreed  to do the PPD reading.                  Action/Plan:   Expected Discharge Date:                  Expected Discharge Plan:     In-House Referral:     Discharge planning Services     Post Acute Care Choice:    Choice offered to:     DME Arranged:    DME Agency:     HH Arranged:    HH Agency:     Status of Service:     If discussed at [employer] of Stay Meetings, dates discussed:    Additional Comments:  Brayant Dorr A, RN 02/28/2017, 4:41 PM

## 2017-02-28 NOTE — Progress Notes (Addendum)
Patient metabolic breathing, dilaudid 1mg  given ineffective. She has no nonverbal signs or symptoms of pain or discomfort. Respirations  30BPM. Husband at bedside.

## 2017-02-28 NOTE — Clinical Social Work Note (Addendum)
CSW consulted for comfort measures. CSW spoke with patient's ICU nurse and was informed that patient decided to stop dialysis and wanted to be placed on comfort measures. Patient to be transferred to 1C. Physician has documented that a hospital death is anticipated. If needed, patient may need to have referral for hospice home. Full assessment has been deferred at this time. CSW to follow. Shela Leff MSW,LCSW [phone]

## 2017-02-28 NOTE — Plan of Care (Addendum)
Patient continues on comfort care. PRN meds given for increased anxiety and restlessness with good effect..  Husband at bedside.  Patient unable to tolerate movement. Becomes very anxious and agitated with movement.. Will continue to monitor.

## 2017-02-28 NOTE — Progress Notes (Signed)
Patient ID: Erin Mccormick, female   DOB: Feb 15, 1935, 82 y.o.   MRN: [id number]  [facility] PROGRESS NOTE  Erin Mccormick [id number] DOB: 1935/02/17 DOA: 02/21/2017 PCP: Dion Body, MD  HPI/Subjective: Patient had permacath placement 02/11/2017 and following that had dialysis but very tired following dialysis and decided not to pursue hemodialysis in future and CODE STATUS was changed to comfort care   Objective: Vitals:   02/28/17 0200 02/28/17 1225  BP:  (!) 84/39  Pulse: (!) 111 (!) 110  Resp: (!) 32 16  Temp:  (!) 97.4 F (36.3 C)  SpO2: 99% (!) 85%    Filed Weights   02/06/2017 1038 02/24/2017 1330 02/25/2017 1448  Weight: 51.7 kg (114 lb) 54.7 kg (120 lb 9.5 oz) 51.2 kg (112 lb 14 oz)    ROS: Review of Systems  Constitutional: Negative for chills and fever.  Eyes: Negative for blurred vision.  Respiratory: Positive for cough and shortness of breath.   Cardiovascular: Negative for chest pain.  Gastrointestinal: Negative for abdominal pain, constipation, diarrhea, nausea and vomiting.  Genitourinary: Negative for dysuria.  Musculoskeletal: Negative for joint pain.  Neurological: Negative for dizziness and headaches.   Exam: Physical Exam  Constitutional: She is oriented to person, place, and time.  HENT:  Nose: No mucosal edema.  Mouth/Throat: No oropharyngeal exudate or posterior oropharyngeal edema.  Eyes: Conjunctivae, EOM and lids are normal. Pupils are equal, round, and reactive to light.  Neck: No JVD present. Carotid bruit is not present. No edema present. No thyroid mass and no thyromegaly present.  Cardiovascular: S1 normal and S2 normal. Exam reveals no gallop.  No murmur heard. Pulses:      Dorsalis pedis pulses are 2+ on the right side, and 2+ on the left side.  Respiratory: No respiratory distress. She has decreased breath sounds in the right middle field, the right lower field and the left lower field. She has no wheezes. She has rhonchi in the  right lower field and the left lower field. She has no rales.  GI: Soft. Bowel sounds are normal. There is no tenderness.  Musculoskeletal:       Right ankle: She exhibits no swelling.       Left ankle: She exhibits no swelling.  Lymphadenopathy:    She has no cervical adenopathy.  Neurological: She is alert and oriented to person, place, and time. No cranial nerve deficit.  Skin: Skin is warm. No rash noted. Nails show no clubbing.  Psychiatric: She has a normal mood and affect.      Data Reviewed: Basic Metabolic Panel: Recent Labs  Lab 02/22/17 0450 02/23/17 0540 02/24/17 0606 02/25/17 0518 02/06/2017 0530  NA 140 138 141 139 138  K 5.1 5.1 5.2* 5.1 4.9  CL 108 106 108 104 105  CO2 21* 24 26 23 25   GLUCOSE 220* 193* 128* 116* 168*  BUN 99* 90* 89* 78* 80*  CREATININE 3.24* 2.99* 3.00* 2.94* 3.02*  CALCIUM 8.5* 8.5* 8.8* 8.4* 8.4*  MG  --   --   --  2.1  --   PHOS 4.5  --   --  4.8*  --    Liver Function Tests: Recent Labs  Lab 02/22/17 0450  ALBUMIN 2.2*   CBC: Recent Labs  Lab 02/22/17 0450 02/23/17 0540 02/24/17 0606 02/25/17 0518 02/12/2017 0530  WBC 8.6 10.0 13.5* 20.0* 19.0*  NEUTROABS 8.0* 9.0* 12.4*  --  17.7*  HGB 9.0* 9.3* 9.2* 7.4* 6.3*  HCT 27.3*  27.9* 28.4* 22.7* 19.5*  MCV 86.5 87.3 87.2 86.9 87.7  PLT 436 401 375 255 217     CBG: Recent Labs  Lab 02/22/17 0719 02/23/17 0757 02/25/17 0741 02/15/2017 0741  GLUCAP 196* 165* 111* 150*    Recent Results (from the past 240 hour(s))  Blood culture (routine x 2)     Status: None   Collection Time: 02/26/2017  9:29 PM  Result Value Ref Range Status   Specimen Description BLOOD BLOOD RIGHT FOREARM  Final   Special Requests   Final    BOTTLES DRAWN AEROBIC AND ANAEROBIC Blood Culture adequate volume   Culture   Final    NO GROWTH 5 DAYS Performed at [facility], [address] [place], [place] [address]    Report Status 02/23/2017 FINAL  Final  MRSA PCR Screening     Status:  None   Collection Time: 02/19/17  2:13 AM  Result Value Ref Range Status   MRSA by PCR NEGATIVE NEGATIVE Final    Comment:        The GeneXpert MRSA Assay (FDA approved for NASAL specimens only), is one component of a comprehensive MRSA colonization surveillance program. It is not intended to diagnose MRSA infection nor to guide or monitor treatment for MRSA infections. Performed at [facility], [address] [place], [place] [address]   Blood culture (routine x 2)     Status: None   Collection Time: 02/19/17  3:30 AM  Result Value Ref Range Status   Specimen Description BLOOD RIGHT ANTECUBITAL  Final   Special Requests   Final    BOTTLES DRAWN AEROBIC AND ANAEROBIC Blood Culture adequate volume   Culture   Final    NO GROWTH 5 DAYS Performed at [facility], [address] [place], [place] [address]    Report Status 02/24/2017 FINAL  Final     Studies: No results found.  Scheduled Meds:  Continuous Infusions:   Assessment/Plan:   1. Acute hypoxic respiratory failure with chronic history of interstitial lung disease  patient on 6 L nasal cannula. 2. Bilateral pneumonia.  Rocephin and Solu-Medrol stopped by critical care specialist.  3. Acute kidney disease on chronic kidney disease stage IV.  Patient refused hemodialysis patient history of renal cell cancer status post left nephrectomy and partial nephrectomy on right; 4. Acute diastolic congestive heart failure.   5. Right renal mass. 6. Hypothyroidism unspecified  7. Anxiety depression  8. Hyperlipidemia unspecified  9. Essential hypertension on metoprolol 10. History of breast cancer  11. Adult failure to thrive  Poor prognosis, DNR with comfort care patient  transferred to the floor  Anticipating hospital death if not we will place her under hospice care  Code Status:     Code Status Orders  (From admission, onward)        Start     Ordered   02/19/17 0226  Full code   Continuous     02/19/17 0225    Code Status History    Date Active Date Inactive Code Status Order ID Comments User Context   07/01/2016 08:53 07/02/2016 14:29 Full Code [id number]  Saundra Shelling, MD Inpatient    Advance Directive Documentation     Most Recent Value  Type of Advance Directive  Living will  Pre-existing out of facility DNR order (yellow form or pink MOST form)  No data  "MOST" Form in Place?  No data     Family Communication: none today Disposition Plan:  Could be hospital death or hospice  Consultants:  Critical care specialist  Nephrologist  Antibiotics:  none  Time spent: 25 minutes  Illene Silver Annalysa Mohammad  [employer]

## 2017-02-28 NOTE — Progress Notes (Signed)
Pulmonary/critical care attending  Patient presently on comfort care. Is on Ativan and fentanyl every 1 hour as needed. She is a little more responsive this morning, weaning to nasal cannula. Had discussion with husband and with patient. Will transfer to floor service when bed becomes available. Pain and symptomatic control only. Patient does not wish any additional labs or hemodialysis.  Erin Mccormick, D.O.

## 2017-03-01 ENCOUNTER — Encounter: Payer: Self-pay | Admitting: Vascular Surgery

## 2017-03-02 ENCOUNTER — Telehealth: Payer: Self-pay

## 2017-03-02 LAB — QUANTIFERON-TB GOLD PLUS: QuantiFERON-TB Gold Plus: UNDETERMINED

## 2017-03-02 LAB — QUANTIFERON-TB GOLD PLUS (RQFGPL)
QuantiFERON Mitogen Value: 0.04 IU/mL
QuantiFERON Nil Value: 0.05 IU/mL
QuantiFERON TB1 Ag Value: 0.05 IU/mL
QuantiFERON TB2 Ag Value: 0.05 IU/mL

## 2017-03-02 NOTE — Telephone Encounter (Signed)
Denice Paradise and [place] Silos notified death certificate ready for pick up

## 2017-03-02 NOTE — Telephone Encounter (Signed)
Death certificate placed in MD folder for completion. DS 

## 2017-03-02 NOTE — Telephone Encounter (Signed)
Recieved Death Certificate from _____[employer]_____  Placed ____in nurse box ________

## 2017-03-05 NOTE — Progress Notes (Signed)
Pt without respirations or heart rate

## 2017-03-05 NOTE — Progress Notes (Signed)
Pt's husband called out to nursing station and requested this nurse come to room. When this nurse entered room pt was not breathing. Another nurse was asked to come to room to verify pt's passing. Pt pronounced by this nurse and De Hollingshead, RN at [facility] am. Family at bedside. [facility] services offered. Family declined. Comfort provided to family. Dr. Jannifer Franklin and [facility] notified. [facility] notified.

## 2017-03-05 DEATH — deceased

## 2017-04-05 NOTE — Discharge Summary (Signed)
  DOA - 02/19/17 Date deceased  - 2017/03/12  HISTORY OF PRESENT ILLNESS: Erin Mccormick  is a 82 y.o. female with a known history of renal cell carcinoma status post nephrectomy, CKD 4, hypertension, hyperlipidemia. She presented to emergency room for generalized weakness and shortness of breath this started acutely, 3 days ago. Patient did not checked her temperature at home but she admits to subjective fever and chills.  She was seen by her primary care physician 2 days ago and was started on doxycycline p.o. for pneumonia there was noted on the chest x-ray.  Despite the antibiotic and over-the-counter medication use, patient continued to feel weaker and more short of breath, so she came to the hospital. At the arrival to emergency room, patient was noted to be hypoxic; oxygen saturation was 68% in room air.  Chest x-ray showed  diffuse Bilateral mostly ground-glass pulmonary opacity compatible with Acute Viral/Atypical Pneumonia. Associated small bilateral layering Pleural Effusions, and also a small Pericardial Effusion are noted. Patient is admitted for further evaluation and treatment   Patient had permacath placement 02/08/2017 and following that had dialysis but very tired following dialysis and decided not to pursue hemodialysis in future and CODE STATUS was changed to comfort care     1. Acute hypoxic respiratory failure with chronic history of interstitial lung disease  patient on 6 L nasal cannula. 2. Bilateral pneumonia.  Rocephin and Solu-Medrol stopped by critical care specialist.  3. Acute kidney disease on chronic kidney disease stage IV.  Patient refused hemodialysis patient history of renal cell cancer status post left nephrectomy and partial nephrectomy on right; 4. Acute diastolic congestive heart failure.   5. Right renal mass. 6. Hypothyroidism unspecified  7. Anxiety depression  8. Hyperlipidemia unspecified  9. Essential hypertension on metoprolol 10. History of breast  cancer  11. Adult failure to thrive  Poor prognosis, DNR with comfort care patient  transferred to the floor  Anticipated hospital death , pt deceased comfortable on 12-Mar-2017 around 12.30 am  Family at  bed side

## 2019-02-17 IMAGING — MR MR ABDOMEN W/O CM
7 of 8 series · 35 of 48 positions shown · non-contrast
Comparison: Ultrasound 02/19/2017 and CT abdomen pelvis 08/05/2015

CLINICAL DATA: History of renal cell carcinoma status post left
nephrectomy and partial right nephrectomy. Abdominal mass.

EXAM:
MRI ABDOMEN WITHOUT CONTRAST
TECHNIQUE: Multiplanar multisequence MR imaging was performed without the
administration of intravenous contrast.

[Series 11: DWI · axial · 5.5mm · 1.67mm/px · z∈[-38,+173]mm · 9 of 99 slices shown]
[im 1/99]
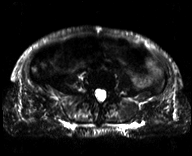
[im 18/99]
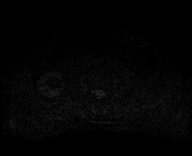
[im 27/99]
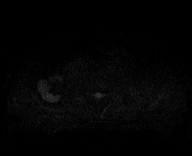
[im 45/99]
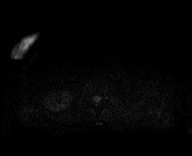
[im 54/99]
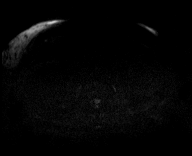
[im 72/99]
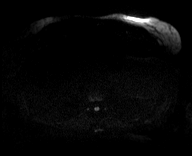
[im 81/99]
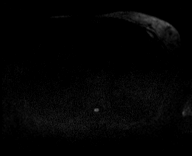
[im 90/99]
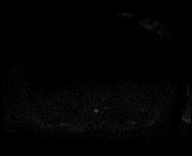
[im 99/99]
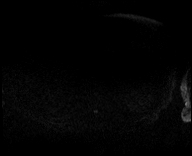

[Series 12: ax dwi_adc · axial · 5.5mm · 1.67mm/px · z∈[-38,+173]mm · 4 of 33 slices shown]
[im 1/33]
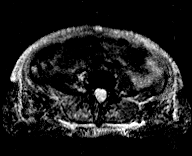
[im 11/33]
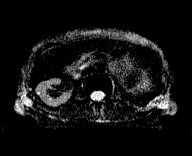
[im 22/33]
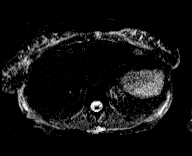
[im 33/33]
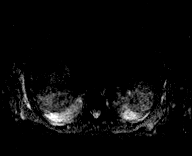

[Series 14: bSSFP · axial · 5.5mm · 0.62mm/px · z∈[-40,+174]mm · 5 of 40 slices shown]
[im 1/40]
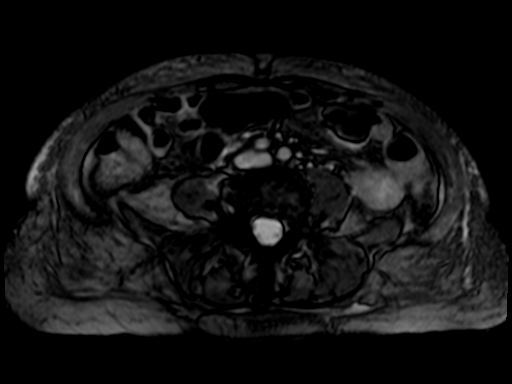
[im 10/40]
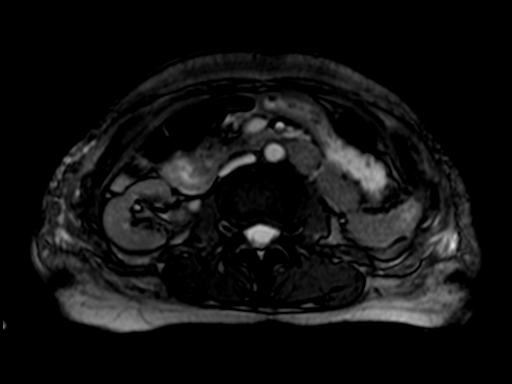
[im 20/40]
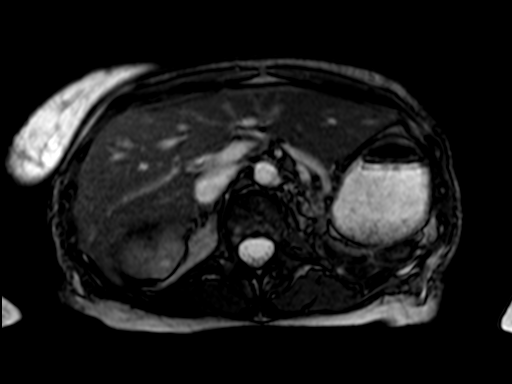
[im 30/40]
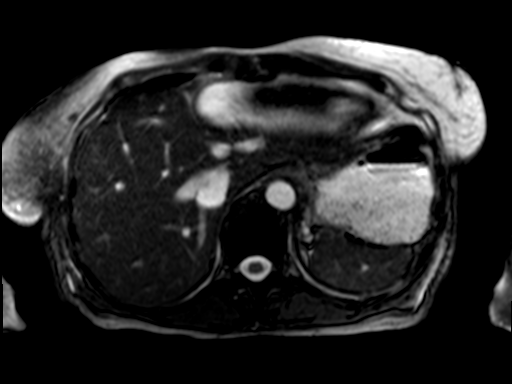
[im 40/40]
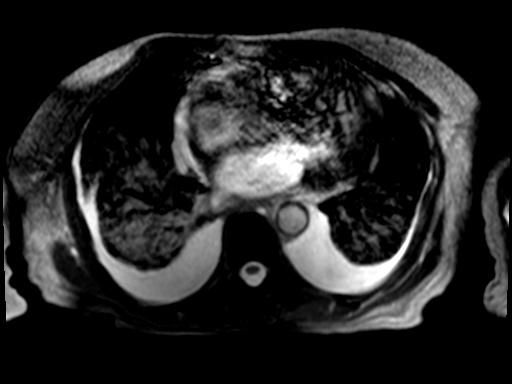

[Series 16: T1 · axial · 5.5mm · 0.62mm/px · z∈[-38,+173]mm · 8 of 66 slices shown]
[im 1/66]
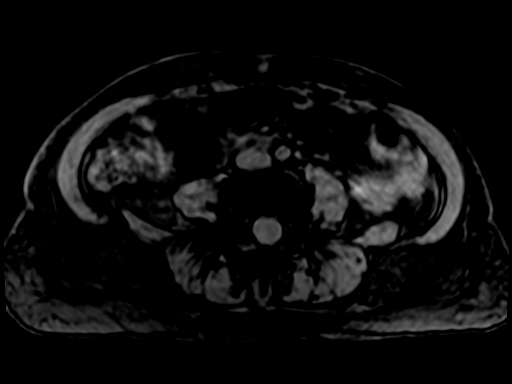
[im 10/66]
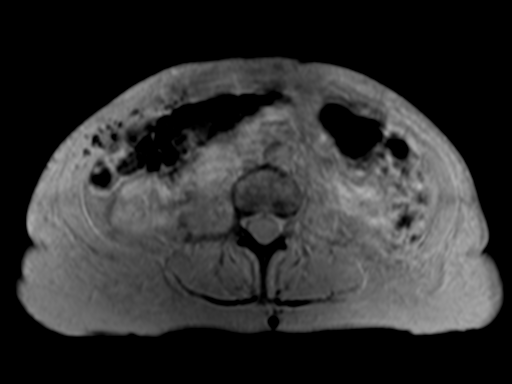
[im 19/66]
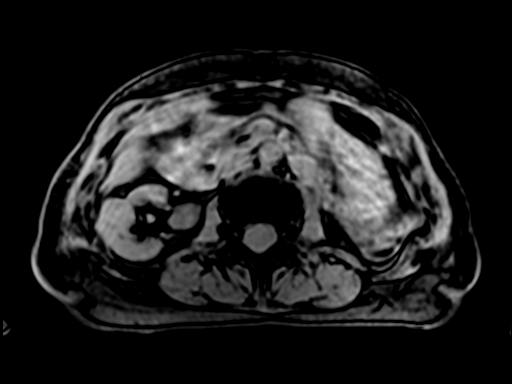
[im 28/66]
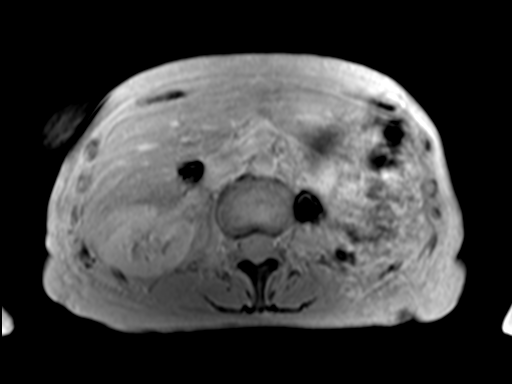
[im 38/66]
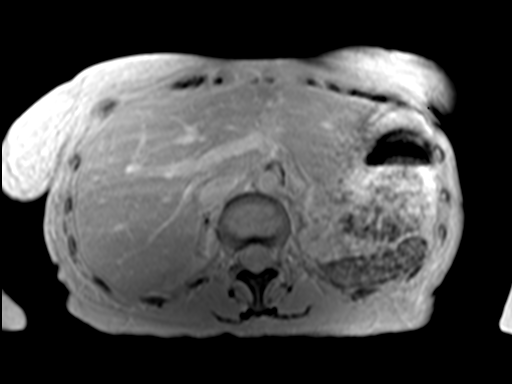
[im 47/66]
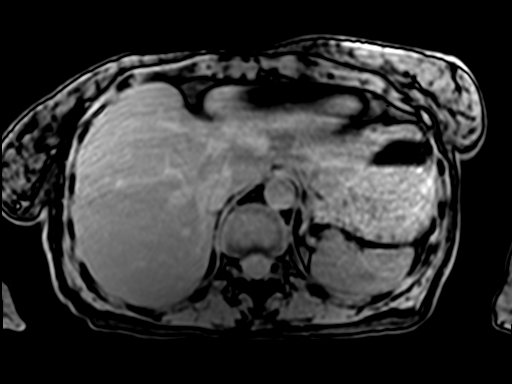
[im 56/66]
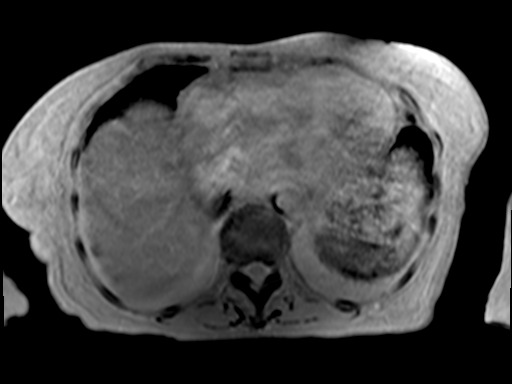
[im 66/66]
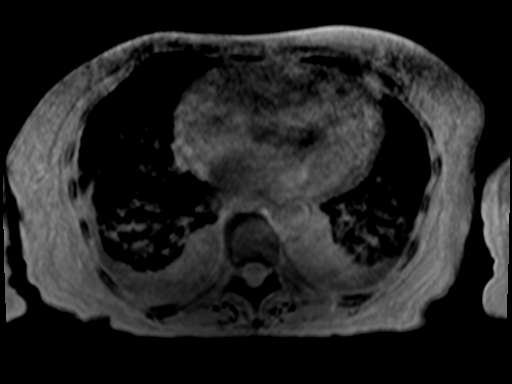

[Series 18: T2 fat-sat · axial · 5.5mm · 0.83mm/px · z∈[-38,+173]mm · 4 of 33 slices shown]
[im 1/33]
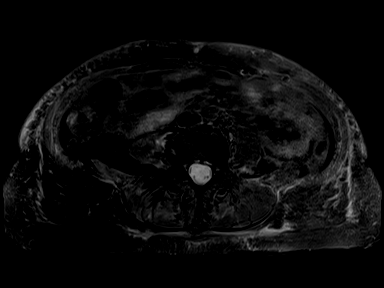
[im 11/33]
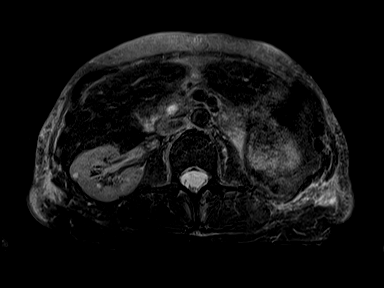
[im 22/33]
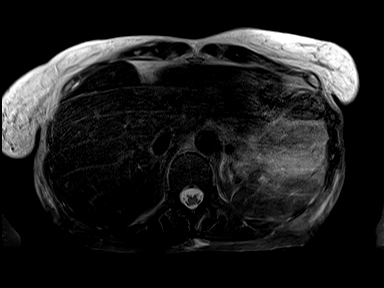
[im 33/33]
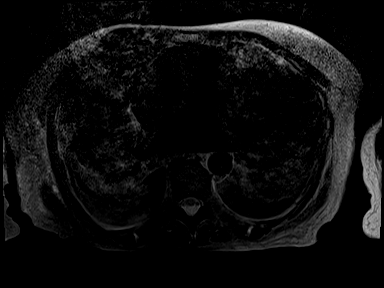

[Series 19: T2 · coronal · 5.0mm · 0.62mm/px · 4 of 32 slices shown]
[im 1/32]
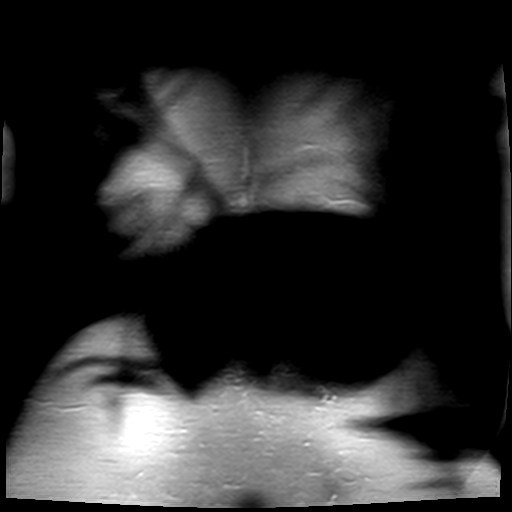
[im 11/32]
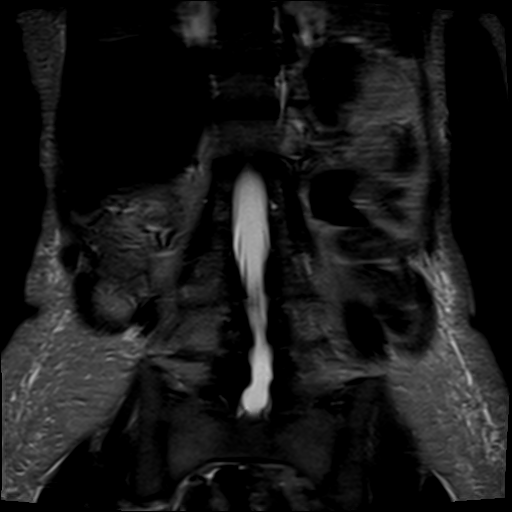
[im 21/32]
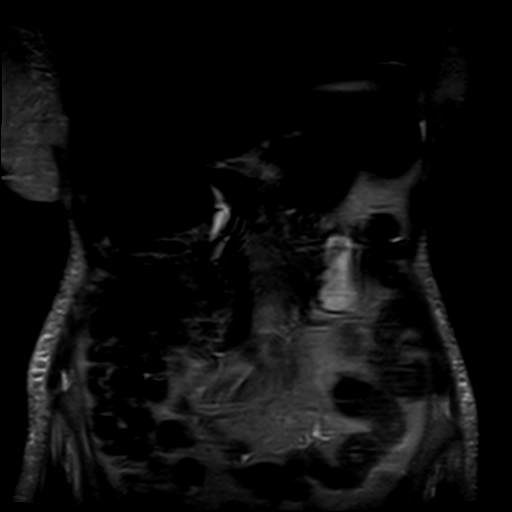
[im 32/32]
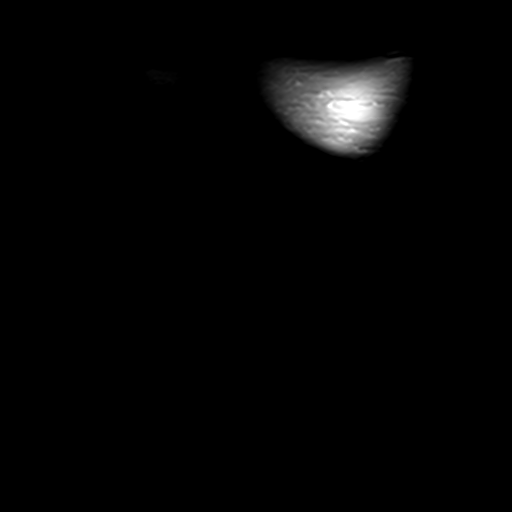

[Series 21: T1 dynamic fat-sat · axial · non-contrast · 4.0mm · 0.62mm/px · 1 of 56 slices shown]
[im 1/56]
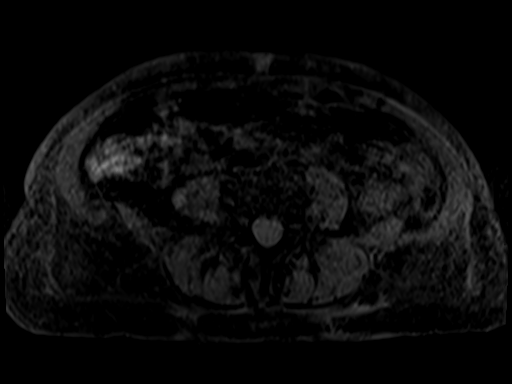

[35 of 48 positions shown; findings below may reference images not displayed]

FINDINGS: Significantly diminished exam detail due to motion artifact. Exam
detail also diminished due to lack of IV contrast material.

Lower chest: Moderate bilateral pleural effusions identified.

Hepatobiliary: No focal liver abnormality identified. Previous
cholecystectomy. No biliary dilatation.

Pancreas: No mass, inflammatory changes, or other parenchymal
abnormality identified.

Spleen:  Within normal limits in size and appearance.

Adrenals/Urinary Tract: No adrenal mass visualized. Status post left
nephrectomy. Arising from the upper pole of right kidney is a T2
hyperintense and T1 isointense structure measuring 9 mm, image 19 of
series 14. Several additional small T2 hyperintense cortical based
lesions noted. Arising from the lateral cortex of the interpolar
right kidney is a focal contour deformity corresponding to the
suspected lesion identified on recent ultrasound. This measures
approximate 1.8 cm and is incompletely characterized without IV
contrast. This lesion appears isointense on the T1 weighted images
and isointense on the T2 weighted images.

Stomach/Bowel: Visualized portions within the abdomen are
unremarkable.

Vascular/Lymphatic: Aortic atherosclerosis identified. No aneurysm..

Other:  None.

Musculoskeletal: No suspicious bone lesions identified.
IMPRESSION: 1. Exam detail is diminished due to lack of IV contrast material and
respiratory motion artifact.
2. Corresponding to the ultrasound abnormality from 02/19/2017 is a
focal contour abnormality with a suspected underlying 1.8 cm kidney
lesion. This is incompletely characterized without IV contrast. An
underlying solid kidney mass cannot be excluded.
3. Right kidney cysts.
4. Bilateral pleural effusions.

## 2019-02-18 IMAGING — DX DG CHEST 1V PORT
1 series · 1 of 1 positions shown · non-contrast
Comparison: 02/18/2017

CLINICAL DATA: Respiratory failure

EXAM:
PORTABLE CHEST 1 VIEW

[chest ap]
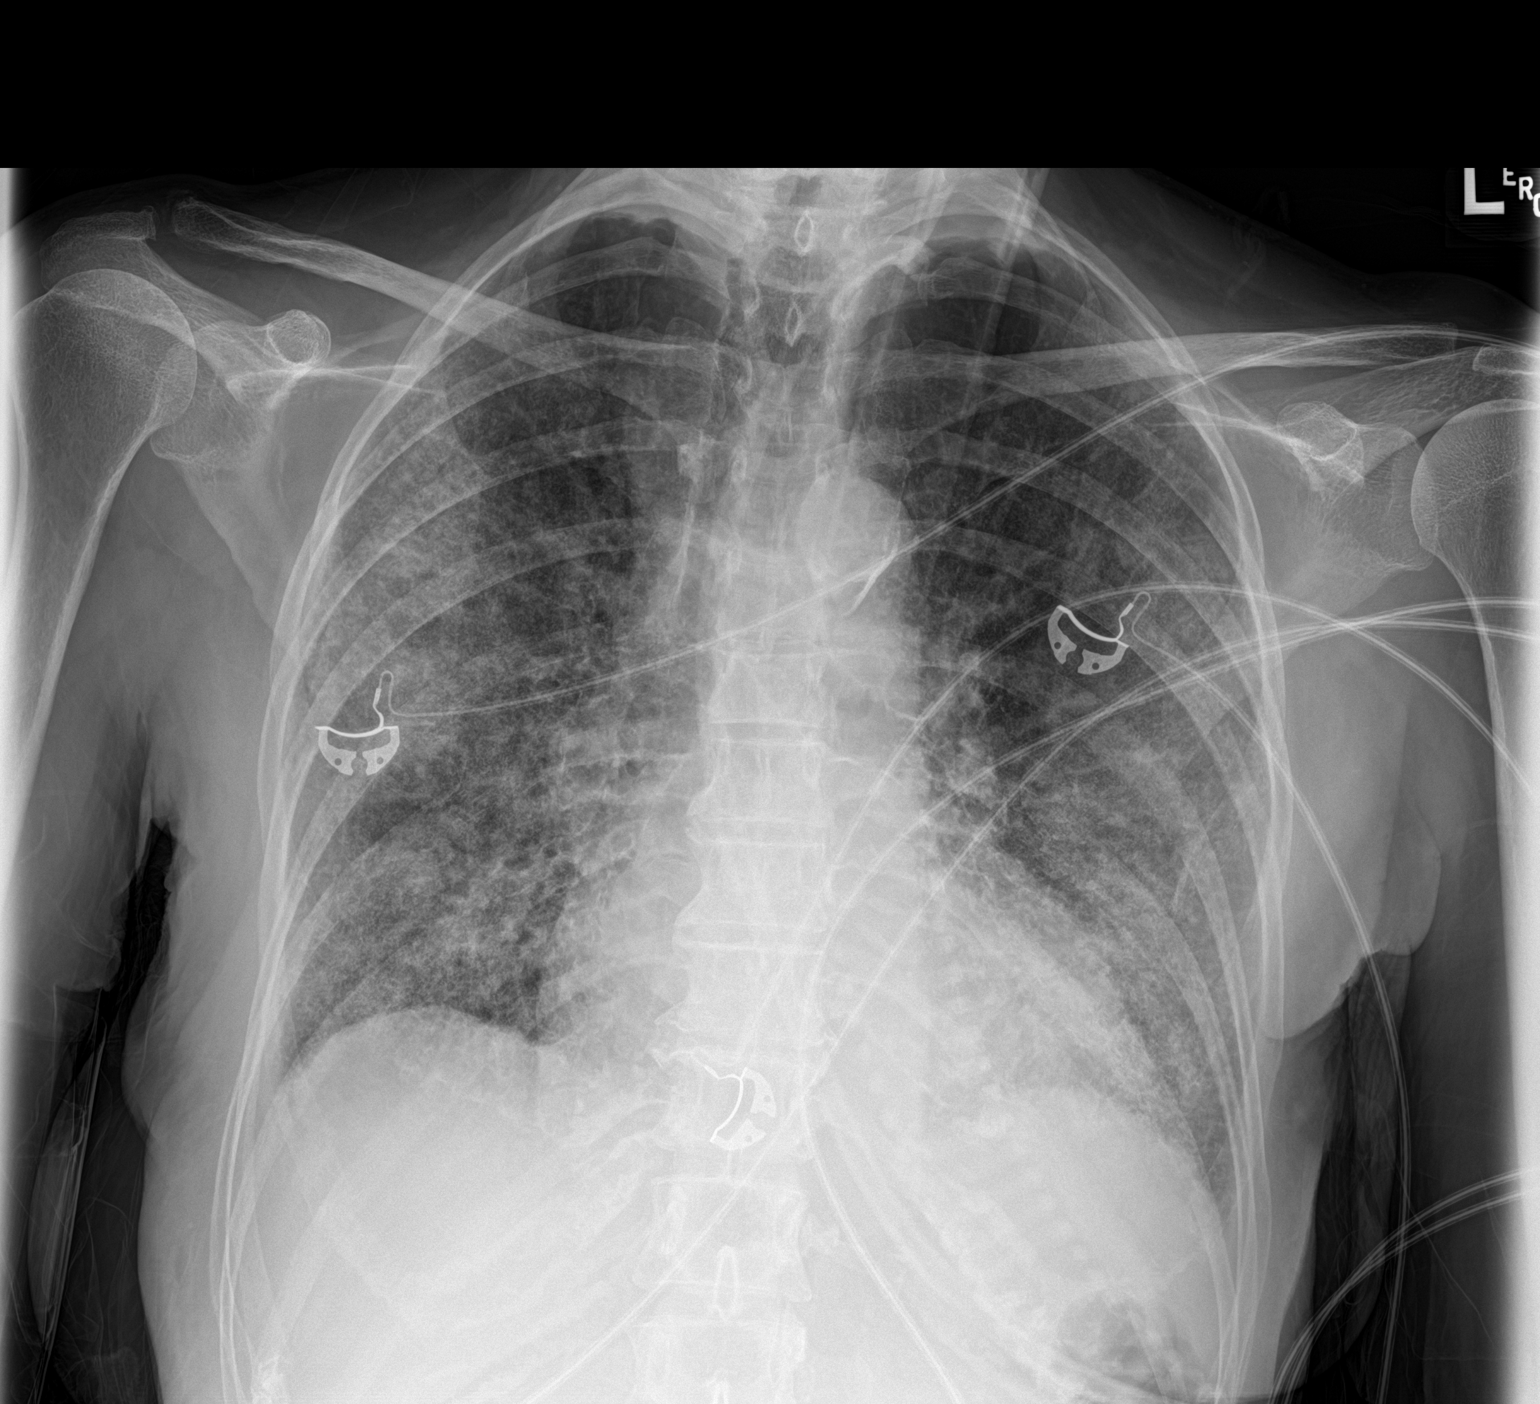

[1 of 1 positions shown; findings below may reference images not displayed]

FINDINGS: Similar asymmetric bilateral diffuse airspace process versus edema.
Heart is enlarged. Small pleural effusions noted. Appearance remains
nonspecific for pneumonia, atypical infection, or edema. No
pneumothorax. Trachea is midline. Atherosclerosis noted of the
aorta. Degenerative changes of the spine.
IMPRESSION: Stable diffuse bilateral airspace process, pneumonia or edema.

## 2019-02-19 IMAGING — DX DG CHEST 1V PORT
1 series · 1 of 1 positions shown · non-contrast
Comparison: February 21, 2017

CLINICAL DATA: Respiratory failure

EXAM:
PORTABLE CHEST 1 VIEW

[chest ap]
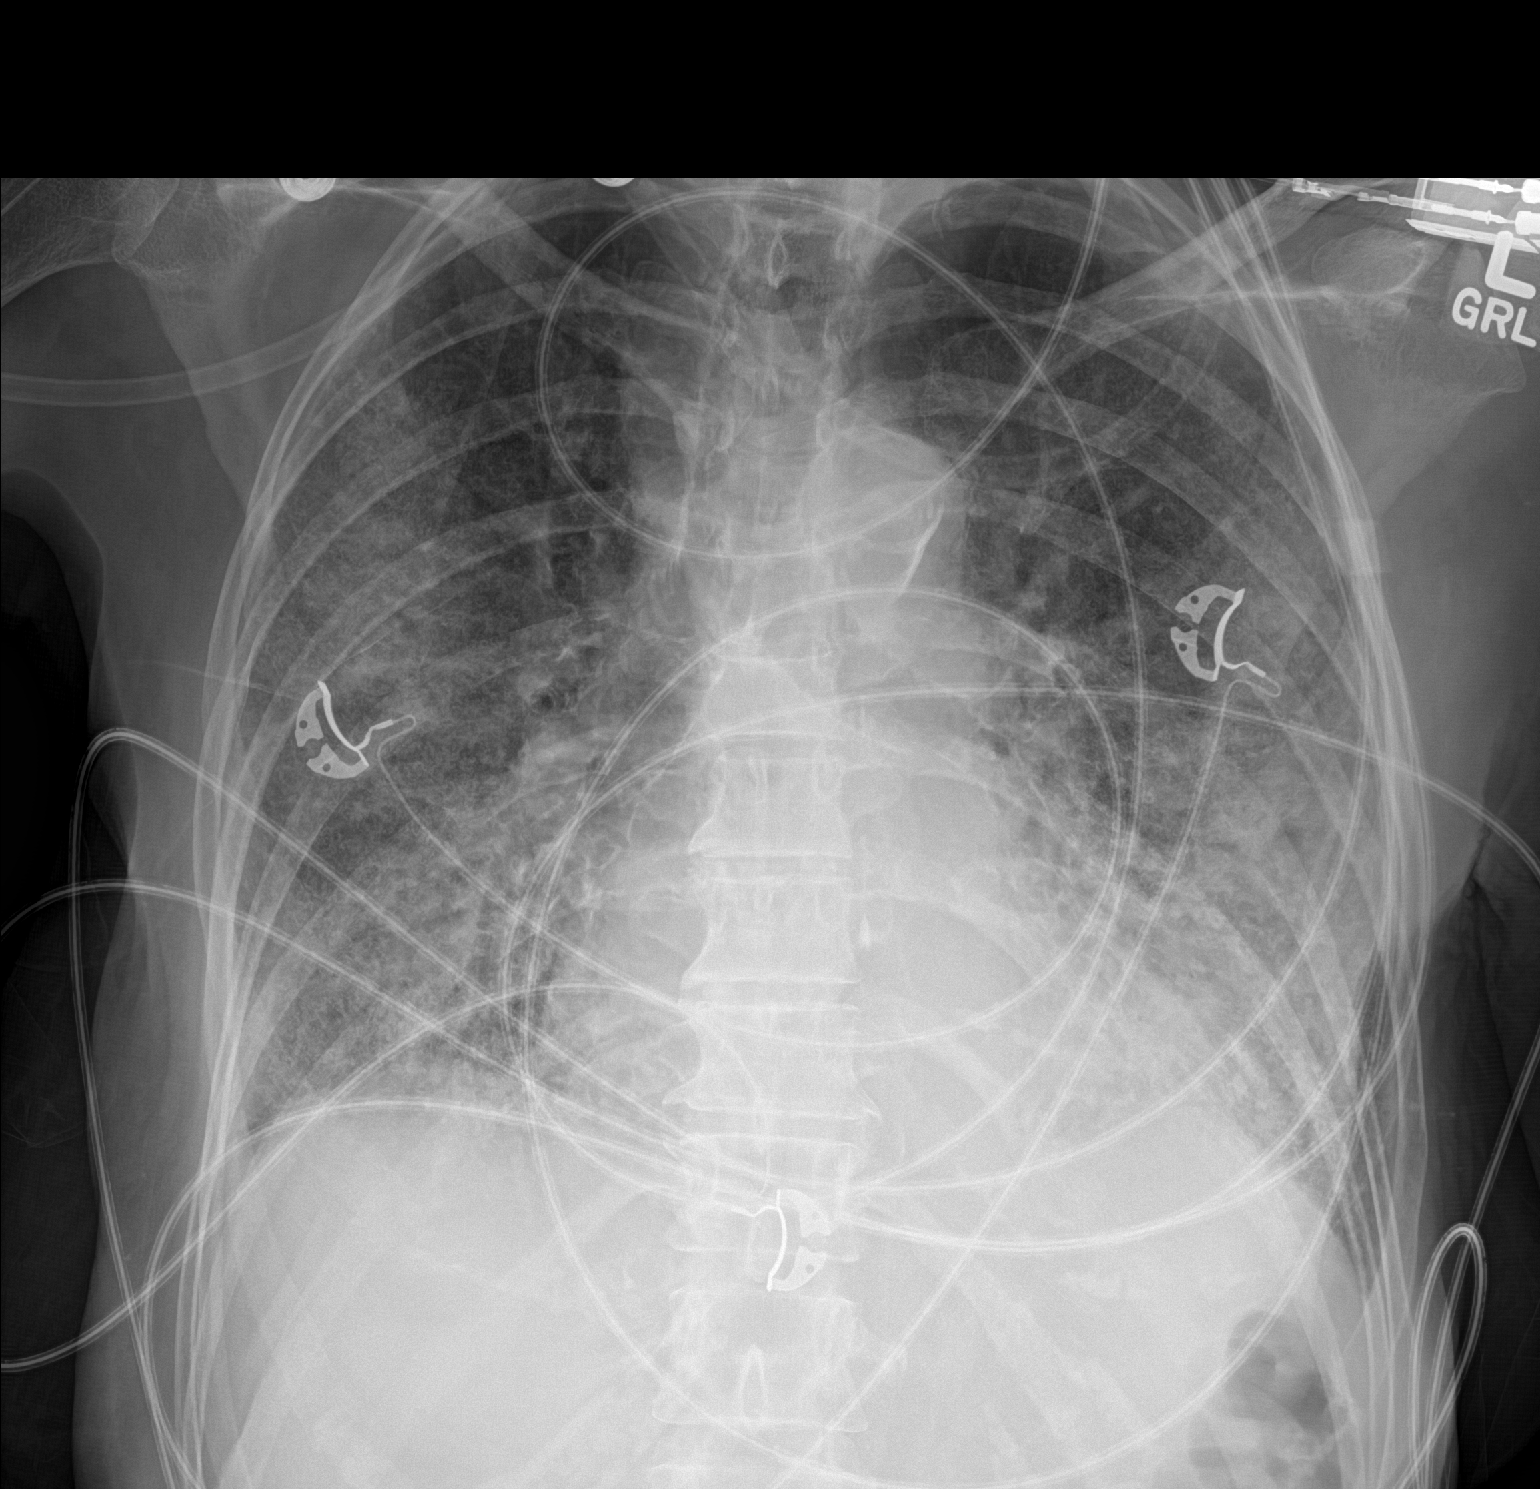

[1 of 1 positions shown; findings below may reference images not displayed]

FINDINGS: There is airspace opacity throughout both lungs bilaterally,
somewhat more on the right than on the left, stable. There is
cardiomegaly with pulmonary venous hypertension. There are stable
small pleural effusions. No adenopathy. There is aortic
atherosclerosis. No evident bone lesions.
IMPRESSION: Pulmonary vascular congestion. Widespread airspace opacity
bilaterally. Question pneumonia versus alveolar edema; both entities
may exist concurrently. Widespread influenza pneumonitis may have
this appearance. The overall distribution and extent of opacity is
stable compared to most recent study. Stable cardiac silhouette.
There is aortic atherosclerosis.

Aortic Atherosclerosis ([id number].E).

## 2019-02-22 IMAGING — DX DG CHEST 1V PORT
1 series · 1 of 1 positions shown · non-contrast
Comparison: Chest x-ray 02/22/2017. Chest x-ray 02/21/2017. Chest
CT 02/18/2017.

CLINICAL DATA: Respiratory failure.

EXAM:
PORTABLE CHEST 1 VIEW

[chest ap]
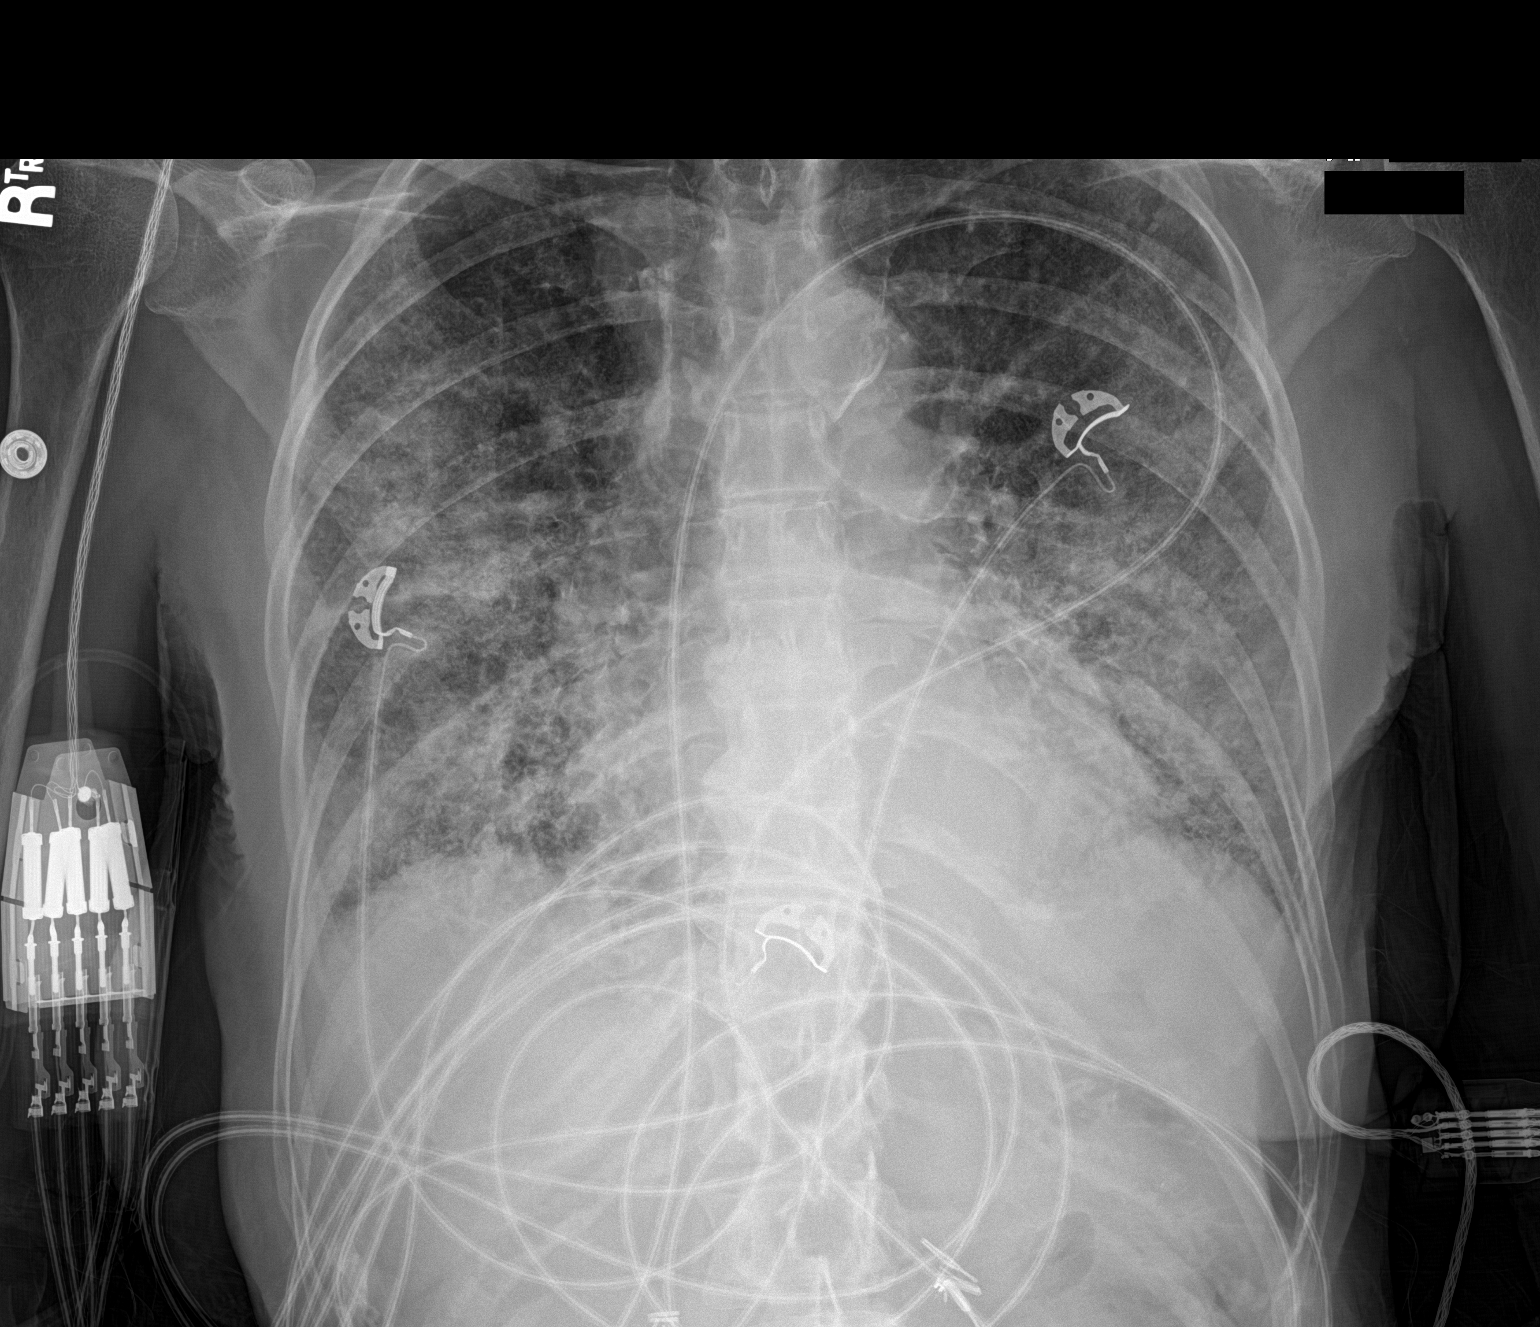

[1 of 1 positions shown; findings below may reference images not displayed]

FINDINGS: Mediastinum is normal. Heart size stable. Diffuse bilateral
pulmonary infiltrates/edema again noted. No significant interim
change. No pleural effusion or pneumothorax.
IMPRESSION: Diffuse bilateral pulmonary infiltrates/edema again noted. No
significant interim change.
